# Patient Record
Sex: Female | Born: 1945
Health system: Southern US, Community
[De-identification: ages and names within clinical notes are randomized; demographics above are authoritative.]

## PROBLEM LIST (undated history)

## (undated) DIAGNOSIS — C50911 Malignant neoplasm of unspecified site of right female breast: Secondary | ICD-10-CM

## (undated) DIAGNOSIS — Q8500 Neurofibromatosis, unspecified: Secondary | ICD-10-CM

## (undated) DIAGNOSIS — J449 Chronic obstructive pulmonary disease, unspecified: Secondary | ICD-10-CM

## (undated) HISTORY — PX: ABDOMINAL HYSTERECTOMY: SHX81

## (undated) HISTORY — DX: Chronic obstructive pulmonary disease, unspecified: J44.9

## (undated) HISTORY — PX: CHOLECYSTECTOMY: SHX55

## (undated) HISTORY — PX: BREAST SURGERY: SHX581

## (undated) HISTORY — PX: MASTECTOMY: SHX3

## (undated) HISTORY — DX: Neurofibromatosis, unspecified: Q85.00

---

## 2001-01-16 DIAGNOSIS — C50911 Malignant neoplasm of unspecified site of right female breast: Secondary | ICD-10-CM

## 2001-01-16 HISTORY — DX: Malignant neoplasm of unspecified site of right female breast: C50.911

## 2006-10-30 ENCOUNTER — Ambulatory Visit: Payer: Self-pay | Admitting: Gastroenterology

## 2008-12-26 ENCOUNTER — Emergency Department: Payer: Self-pay | Admitting: Emergency Medicine
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
## 2011-06-21 ENCOUNTER — Emergency Department: Payer: Self-pay | Admitting: *Deleted

## 2011-06-21 LAB — URINALYSIS, COMPLETE
Bacteria: NONE SEEN
Bilirubin,UR: NEGATIVE
Glucose,UR: NEGATIVE mg/dL (ref 0–75)
Leukocyte Esterase: NEGATIVE
Nitrite: NEGATIVE
Ph: 5 (ref 4.5–8.0)
Protein: NEGATIVE
Specific Gravity: 1.016 (ref 1.003–1.030)
Squamous Epithelial: 4
WBC UR: 1 /HPF (ref 0–5)

## 2011-06-21 LAB — BASIC METABOLIC PANEL
Anion Gap: 8 (ref 7–16)
Calcium, Total: 8.4 mg/dL — ABNORMAL LOW (ref 8.5–10.1)
Co2: 26 mmol/L (ref 21–32)
Creatinine: 0.76 mg/dL (ref 0.60–1.30)
Potassium: 4.9 mmol/L (ref 3.5–5.1)

## 2011-06-21 LAB — CBC
HCT: 42.4 % (ref 35.0–47.0)
MCV: 91 fL (ref 80–100)
RBC: 4.63 10*6/uL (ref 3.80–5.20)
WBC: 7.7 10*3/uL (ref 3.6–11.0)

## 2014-09-10 ENCOUNTER — Encounter: Payer: Self-pay | Admitting: Family Medicine

## 2014-09-10 ENCOUNTER — Ambulatory Visit (INDEPENDENT_AMBULATORY_CARE_PROVIDER_SITE_OTHER): Payer: Commercial Managed Care - HMO | Admitting: Family Medicine

## 2014-09-10 VITALS — BP 114/69 | HR 64 | Temp 98.2°F | Ht 60.2 in | Wt 98.2 lb

## 2014-09-10 DIAGNOSIS — Z853 Personal history of malignant neoplasm of breast: Secondary | ICD-10-CM

## 2014-09-10 DIAGNOSIS — J449 Chronic obstructive pulmonary disease, unspecified: Secondary | ICD-10-CM

## 2014-09-10 DIAGNOSIS — Z23 Encounter for immunization: Secondary | ICD-10-CM | POA: Diagnosis not present

## 2014-09-10 DIAGNOSIS — E785 Hyperlipidemia, unspecified: Secondary | ICD-10-CM | POA: Diagnosis not present

## 2014-09-10 DIAGNOSIS — Z720 Tobacco use: Secondary | ICD-10-CM | POA: Insufficient documentation

## 2014-09-10 LAB — CBC WITH DIFFERENTIAL/PLATELET
HEMATOCRIT: 44.1 % (ref 34.0–46.6)
HEMOGLOBIN: 14.7 g/dL (ref 11.1–15.9)
LYMPHS: 18 %
Lymphocytes Absolute: 1.2 10*3/uL (ref 0.7–3.1)
MCH: 30.8 pg (ref 26.6–33.0)
MCHC: 33.3 g/dL (ref 31.5–35.7)
MCV: 93 fL (ref 79–97)
MID (Absolute): 0.7 10*3/uL (ref 0.1–1.6)
MID: 11 %
NEUTROS ABS: 4.6 10*3/uL (ref 1.4–7.0)
NEUTROS PCT: 71 %
Platelets: 246 10*3/uL (ref 150–379)
RBC: 4.77 x10E6/uL (ref 3.77–5.28)
RDW: 15 % (ref 12.3–15.4)
WBC: 6.5 10*3/uL (ref 3.4–10.8)

## 2014-09-10 LAB — LIPID PANEL PICCOLO, WAIVED
Chol/HDL Ratio Piccolo,Waive: 2.9 mg/dL
Cholesterol Piccolo, Waived: 216 mg/dL — ABNORMAL HIGH (ref <200)
HDL Chol Piccolo, Waived: 75 mg/dL (ref >59)
LDL Chol Calc Piccolo Waived: 122 mg/dL — ABNORMAL HIGH (ref <100)
Triglycerides Piccolo,Waived: 97 mg/dL (ref <150)
VLDL Chol Calc Piccolo,Waive: 19 mg/dL (ref <30)

## 2014-09-10 MED ORDER — TIOTROPIUM BROMIDE MONOHYDRATE 18 MCG IN CAPS: 18.0000 ug | ORAL_CAPSULE | Freq: Every day | RESPIRATORY_TRACT | Status: DC

## 2014-09-10 MED ORDER — ALBUTEROL SULFATE HFA 108 (90 BASE) MCG/ACT IN AERS: 2.0000 | INHALATION_SPRAY | Freq: Four times a day (QID) | RESPIRATORY_TRACT | Status: DC | PRN

## 2014-09-10 NOTE — Patient Instructions (Addendum)
Chronic Obstructive Pulmonary Disease Chronic obstructive pulmonary disease (COPD) is a common lung condition in which airflow from the lungs is limited. COPD is a general term that can be used to describe many different lung problems that limit airflow, including both chronic bronchitis and emphysema. If you have COPD, your lung function will probably never return to normal, but there are measures you can take to improve lung function and make yourself feel better.  CAUSES   Smoking (common).   Exposure to secondhand smoke.   Genetic problems.  Chronic inflammatory lung diseases or recurrent infections. SYMPTOMS   Shortness of breath, especially with physical activity.   Deep, persistent (chronic) cough with a large amount of thick mucus.   Wheezing.   Rapid breaths (tachypnea).   Gray or bluish discoloration (cyanosis) of the skin, especially in fingers, toes, or lips.   Fatigue.   Weight loss.   Frequent infections or episodes when breathing symptoms become much worse (exacerbations).   Chest tightness. DIAGNOSIS  Your health care provider will take a medical history and perform a physical examination to make the initial diagnosis. Additional tests for COPD may include:   Lung (pulmonary) function tests.  Chest X-ray.  CT scan.  Blood tests. TREATMENT  Treatment available to help you feel better when you have COPD includes:   Inhaler and nebulizer medicines. These help manage the symptoms of COPD and make your breathing more comfortable.  Supplemental oxygen. Supplemental oxygen is only helpful if you have a low oxygen level in your blood.   Exercise and physical activity. These are beneficial for nearly all people with COPD. Some people may also benefit from a pulmonary rehabilitation program. HOME CARE INSTRUCTIONS   Take all medicines (inhaled or pills) as directed by your health care provider.  Avoid over-the-counter medicines or cough syrups  that dry up your airway (such as antihistamines) and slow down the elimination of secretions unless instructed otherwise by your health care provider.   If you are a smoker, the most important thing that you can do is stop smoking. Continuing to smoke will cause further lung damage and breathing trouble. Ask your health care provider for help with quitting smoking. He or she can direct you to community resources or hospitals that provide support.  Avoid exposure to irritants such as smoke, chemicals, and fumes that aggravate your breathing.  Use oxygen therapy and pulmonary rehabilitation if directed by your health care provider. If you require home oxygen therapy, ask your health care provider whether you should purchase a pulse oximeter to measure your oxygen level at home.   Avoid contact with individuals who have a contagious illness.  Avoid extreme temperature and humidity changes.  Eat healthy foods. Eating smaller, more frequent meals and resting before meals may help you maintain your strength.  Stay active, but balance activity with periods of rest. Exercise and physical activity will help you maintain your ability to do things you want to do.  Preventing infection and hospitalization is very important when you have COPD. Make sure to receive all the vaccines your health care provider recommends, especially the pneumococcal and influenza vaccines. Ask your health care provider whether you need a pneumonia vaccine.  Learn and use relaxation techniques to manage stress.  Learn and use controlled breathing techniques as directed by your health care provider. Controlled breathing techniques include:   Pursed lip breathing. Start by breathing in (inhaling) through your nose for 1 second. Then, purse your lips as if you were   going to whistle and breathe out (exhale) through the pursed lips for 2 seconds.   Diaphragmatic breathing. Start by putting one hand on your abdomen just above  your waist. Inhale slowly through your nose. The hand on your abdomen should move out. Then purse your lips and exhale slowly. You should be able to feel the hand on your abdomen moving in as you exhale.   Learn and use controlled coughing to clear mucus from your lungs. Controlled coughing is a series of short, progressive coughs. The steps of controlled coughing are:  1. Lean your head slightly forward.  2. Breathe in deeply using diaphragmatic breathing.  3. Try to hold your breath for 3 seconds.  4. Keep your mouth slightly open while coughing twice.  5. Spit any mucus out into a tissue.  6. Rest and repeat the steps once or twice as needed. SEEK MEDICAL CARE IF:   You are coughing up more mucus than usual.   There is a change in the color or thickness of your mucus.   Your breathing is more labored than usual.   Your breathing is faster than usual.  SEEK IMMEDIATE MEDICAL CARE IF:   You have shortness of breath while you are resting.   You have shortness of breath that prevents you from:  Being able to talk.   Performing your usual physical activities.   You have chest pain lasting longer than 5 minutes.   Your skin color is more cyanotic than usual.  You measure low oxygen saturations for longer than 5 minutes with a pulse oximeter. MAKE SURE YOU:   Understand these instructions.  Will watch your condition.  Will get help right away if you are not doing well or get worse. Document Released: 10/12/2004 Document Revised: 05/19/2013 Document Reviewed: 08/29/2012 [facility] Patient Information 2015 [place], [place]. This information is not intended to replace advice given to you by your health care provider. Make sure you discuss any questions you have with your health care provider. Cholesterol Cholesterol is a fat. Your body needs a small amount of cholesterol. Cholesterol may build up in your blood vessels. This increases your chance of having a heart  attack or stroke. You cannot feel your cholesterol levels. The only way to know your cholesterol level is high is with a blood test. Keep your test results. Work with your doctor to keep your cholesterol at a good level. WHAT DO THE TEST RESULTS MEAN?  Total cholesterol is how much cholesterol is in your blood.  LDL is bad cholesterol. This is the type that can build up. You want LDL to be low.  HDL is good cholesterol. It cleans your blood vessels and carries LDL away. You want HDL to be high.  Triglycerides are fat that the body can burn for energy or store. WHAT ARE GOOD LEVELS OF CHOLESTEROL?  Total cholesterol below 200.  LDL below 100 for people at risk. Below 70 for those at very high risk.  HDL above 50 is good. Above 60 is best.  Triglycerides below 150. HOW CAN I LOWER MY CHOLESTEROL?  Diet. Follow your diet programs as told by your doctor.  Choose fish, white meat chicken, roasted [place], or baked [place]. Try not to eat red meat, fried foods, or processed meats such as sausage and lunch meats.  Eat lots of fresh fruits and vegetables.  Choose whole grains, beans, pasta, potatoes, and cereals.  Use only small amounts of olive, corn, or canola oils.  Try not  to eat butter, mayonnaise, shortening, or palm kernel oils.  Try not to eat foods with trans fats.  Drink skim or nonfat milk. Eat low-fat or nonfat yogurt and cheeses. Try not to drink whole milk or cream. Try not to eat ice cream, egg yolks, and full-fat cheeses.  Healthy desserts include angel food cake, ginger snaps, animal crackers, hard candy, popsicles, and low-fat or nonfat frozen yogurt. Try not to eat pastries, cakes, pies, and cookies.  Exercise. Follow your exercise programs as told by your doctor.  Be more active. You can try gardening, walking, or taking the stairs. Ask your doctor about how you can be more active.  Medicine. Take medicine as told by your doctor. Document Released: 03/31/2008  Document Revised: 05/19/2013 Document Reviewed: 10/16/2012 [facility] Patient Information 2015 [place], [place]. This information is not intended to replace advice given to you by your health care provider. Make sure you discuss any questions you have with your health care provider. You Can Quit Smoking If you are ready to quit smoking or are thinking about it, congratulations! You have chosen to help yourself be healthier and live longer! There are lots of different ways to quit smoking. Nicotine gum, nicotine patches, a nicotine inhaler, or nicotine nasal spray can help with physical craving. Hypnosis, support groups, and medicines help break the habit of smoking. TIPS TO GET OFF AND STAY OFF CIGARETTES  Learn to predict your moods. Do not let a bad situation be your excuse to have a cigarette. Some situations in your life might tempt you to have a cigarette.  Ask friends and co-workers not to smoke around you.  Make your home smoke-free.  Never have "just one" cigarette. It leads to wanting another and another. Remind yourself of your decision to quit.  On a card, make a list of your reasons for not smoking. Read it at least the same number of times a day as you have a cigarette. Tell yourself everyday, "I do not want to smoke. I choose not to smoke."  Ask someone at home or work to help you with your plan to quit smoking.  Have something planned after you eat or have a cup of coffee. Take a walk or get other exercise to perk you up. This will help to keep you from overeating.  Try a relaxation exercise to calm you down and decrease your stress. Remember, you may be tense and nervous the first two weeks after you quit. This will pass.  Find new activities to keep your hands busy. Play with a pen, coin, or rubber band. Doodle or draw things on paper.  Brush your teeth right after eating. This will help cut down the craving for the taste of tobacco after meals. You can try mouthwash too.  Try  gum, breath mints, or diet candy to keep something in your mouth. IF YOU SMOKE AND WANT TO QUIT:  Do not stock up on cigarettes. Never buy a carton. Wait until one pack is finished before you buy another.  Never carry cigarettes with you at work or at home.  Keep cigarettes as far away from you as possible. Leave them with someone else.  Never carry matches or a lighter with you.  Ask yourself, "Do I need this cigarette or is this just a reflex?"  Bet with someone that you can quit. Put cigarette money in a piggy bank every morning. If you smoke, you give up the money. If you do not smoke, by the end  of the week, you keep the money.  Keep trying. It takes 21 days to change a habit!  Talk to your doctor about using medicines to help you quit. These include nicotine replacement gum, lozenges, or skin patches. Document Released: 10/29/2008 Document Revised: 03/27/2011 Document Reviewed: 10/29/2008 [facility] Patient Information 2015 [place], [place]. This information is not intended to replace advice given to you by your health care provider. Make sure you discuss any questions you have with your health care provider.

## 2014-09-10 NOTE — Assessment & Plan Note (Signed)
Smoking about a cigarette a day. Encouraged patient to quit smoking. Education provided.

## 2014-09-10 NOTE — Assessment & Plan Note (Signed)
Will restart her spiriva and has ventolin as needed. Continue to monitor.

## 2014-09-10 NOTE — Assessment & Plan Note (Signed)
Borderline. Continue diet and exercise. Recheck in 6 months to a year.

## 2014-09-10 NOTE — Progress Notes (Signed)
BP 114/69 mmHg  Pulse 64  Temp(Src) 98.2 F (36.8 C)  Ht 5' 0.2" (1.529 m)  Wt 98 lb 3.2 oz (44.543 kg)  BMI 19.05 kg/m2  SpO2 95%   Subjective:    Patient ID: Brenda Rocha, female    DOB: [birth year], 69 y.o.   MRN: [id number]  HPI: Brenda Rocha is a 69 y.o. female  Chief Complaint  Patient presents with  . COPD  . Hyperlipidemia   COPD- has not been on her spiriva in quite a while COPD status: stable Satisfied with current treatment?: yes Oxygen use: no Dyspnea frequency: never Cough frequency: never Rescue inhaler frequency:  never Limitation of activity: no Productive cough: no Last Spirometry: Today Pneumovax: Up to Date  Prevnar: Given today Influenza: Up to Date  HYPERLIPIDEMIA Satisfied with current treatment?  yes Aspirin:  no Chest pain:  no Coronary artery disease:  no Family history CAD:  no Family history early CAD:  no  Relevant past medical, surgical, family and social history reviewed and updated as indicated. Interim medical history since our last visit reviewed. Allergies and medications reviewed and updated.  Review of Systems  Constitutional: Negative.   Respiratory: Negative.   Cardiovascular: Negative.   Gastrointestinal: Negative.   Psychiatric/Behavioral: Negative.    Per HPI unless specifically indicated above     Objective:    BP 114/69 mmHg  Pulse 64  Temp(Src) 98.2 F (36.8 C)  Ht 5' 0.2" (1.529 m)  Wt 98 lb 3.2 oz (44.543 kg)  BMI 19.05 kg/m2  SpO2 95%  Wt Readings from Last 3 Encounters:  09/10/14 98 lb 3.2 oz (44.543 kg)  11/28/12 93 lb (42.185 kg)    Physical Exam  Constitutional: She is oriented to person, place, and time. She appears well-developed and well-nourished. No distress.  HENT:  Head: Normocephalic and atraumatic.  Right Ear: Hearing normal.  Left Ear: Hearing normal.  Nose: Nose normal.  Eyes: Conjunctivae and lids are normal. Right eye exhibits no discharge. Left eye exhibits no discharge.  No scleral icterus.  Cardiovascular: Normal rate, regular rhythm, normal heart sounds and intact distal pulses.  Exam reveals no gallop and no friction rub.   No murmur heard. Pulmonary/Chest: Effort normal and breath sounds normal. No respiratory distress. She has no wheezes. She has no rales. She exhibits no tenderness.  Musculoskeletal: Normal range of motion.  Neurological: She is alert and oriented to person, place, and time.  Skin: Skin is warm, dry and intact. No rash noted. No erythema. No pallor.  Psychiatric: She has a normal mood and affect. Her speech is normal and behavior is normal. Judgment and thought content normal. Cognition and memory are normal.      Assessment & Plan:   Problem List Items Addressed This Visit      Respiratory   COLD (chronic obstructive lung disease) - Primary    Will restart her spiriva and has ventolin as needed. Continue to monitor.       Relevant Medications   tiotropium (SPIRIVA HANDIHALER) 18 MCG inhalation capsule   albuterol (PROVENTIL HFA;VENTOLIN HFA) 108 (90 BASE) MCG/ACT inhaler   Other Relevant Orders   Spirometry with Graph   CBC with Differential/Platelet   Spirometry with Graph (Completed)     Other   History of breast cancer    S/p double mastectomy. Cleared by surgery to not follow up.       Relevant Orders   Lipid Panel Piccolo, Waived   Comprehensive metabolic panel  CBC with Differential/Platelet   Hyperlipidemia    Borderline. Continue diet and exercise. Recheck in 6 months to a year.       Relevant Orders   Lipid Panel Piccolo, Waived   CBC with Differential/Platelet   Tobacco abuse    Smoking about a cigarette a day. Encouraged patient to quit smoking. Education provided.      Relevant Orders   Comprehensive metabolic panel   CBC with Differential/Platelet       Follow up plan: Return in about 6 months (around 03/13/2015) for medicare wellness.

## 2014-09-10 NOTE — Assessment & Plan Note (Signed)
S/p double mastectomy. Cleared by surgery to not follow up.

## 2014-09-11 ENCOUNTER — Encounter: Payer: Self-pay | Admitting: Family Medicine

## 2014-09-11 LAB — COMPREHENSIVE METABOLIC PANEL
A/G RATIO: 1.9 (ref 1.1–2.5)
ALBUMIN: 4 g/dL (ref 3.6–4.8)
ALT: 6 IU/L (ref 0–32)
AST: 12 IU/L (ref 0–40)
Alkaline Phosphatase: 69 IU/L (ref 39–117)
BILIRUBIN TOTAL: 0.6 mg/dL (ref 0.0–1.2)
BUN / CREAT RATIO: 13 (ref 11–26)
BUN: 11 mg/dL (ref 8–27)
CALCIUM: 9.2 mg/dL (ref 8.7–10.3)
CO2: 23 mmol/L (ref 18–29)
Chloride: 105 mmol/L (ref 97–108)
Creatinine, Ser: 0.84 mg/dL (ref 0.57–1.00)
GFR, EST AFRICAN AMERICAN: 82 mL/min/{1.73_m2} (ref >59)
GFR, EST NON AFRICAN AMERICAN: 71 mL/min/{1.73_m2} (ref >59)
GLOBULIN, TOTAL: 2.1 g/dL (ref 1.5–4.5)
Glucose: 89 mg/dL (ref 65–99)
POTASSIUM: 4.7 mmol/L (ref 3.5–5.2)
Sodium: 143 mmol/L (ref 134–144)
TOTAL PROTEIN: 6.1 g/dL (ref 6.0–8.5)

## 2014-09-14 ENCOUNTER — Telehealth: Payer: Self-pay | Admitting: Family Medicine

## 2014-09-14 NOTE — Telephone Encounter (Signed)
Called pharmacy. They have the prescriptions at the pharmacy in her chart. Let patient know this by voicemail.

## 2014-09-14 NOTE — Telephone Encounter (Signed)
Pt called stated pharmacy has not received RX for albuterol and Spiriva. Pharm is [facility] in [place]. Thanks.

## 2015-03-04 DIAGNOSIS — H5210 Myopia, unspecified eye: Secondary | ICD-10-CM | POA: Diagnosis not present

## 2015-03-04 DIAGNOSIS — H524 Presbyopia: Secondary | ICD-10-CM | POA: Diagnosis not present

## 2015-03-05 DIAGNOSIS — C50112 Malignant neoplasm of central portion of left female breast: Secondary | ICD-10-CM | POA: Diagnosis not present

## 2015-03-05 DIAGNOSIS — Z9013 Acquired absence of bilateral breasts and nipples: Secondary | ICD-10-CM | POA: Diagnosis not present

## 2015-03-05 DIAGNOSIS — C50111 Malignant neoplasm of central portion of right female breast: Secondary | ICD-10-CM | POA: Diagnosis not present

## 2015-03-10 DIAGNOSIS — Z0000 Encounter for general adult medical examination without abnormal findings: Secondary | ICD-10-CM | POA: Diagnosis not present

## 2015-03-10 DIAGNOSIS — F17200 Nicotine dependence, unspecified, uncomplicated: Secondary | ICD-10-CM | POA: Diagnosis not present

## 2015-03-10 DIAGNOSIS — Z853 Personal history of malignant neoplasm of breast: Secondary | ICD-10-CM | POA: Diagnosis not present

## 2015-03-10 DIAGNOSIS — E782 Mixed hyperlipidemia: Secondary | ICD-10-CM | POA: Diagnosis not present

## 2015-03-10 DIAGNOSIS — Z681 Body mass index (BMI) 19 or less, adult: Secondary | ICD-10-CM | POA: Diagnosis not present

## 2015-03-10 DIAGNOSIS — J449 Chronic obstructive pulmonary disease, unspecified: Secondary | ICD-10-CM | POA: Diagnosis not present

## 2015-03-11 ENCOUNTER — Encounter: Payer: Commercial Managed Care - HMO | Admitting: Family Medicine

## 2015-03-18 ENCOUNTER — Encounter: Payer: Self-pay | Admitting: Family Medicine

## 2015-03-18 ENCOUNTER — Ambulatory Visit (INDEPENDENT_AMBULATORY_CARE_PROVIDER_SITE_OTHER): Payer: Commercial Managed Care - HMO | Admitting: Family Medicine

## 2015-03-18 VITALS — BP 128/70 | HR 72 | Temp 97.8°F | Ht 60.2 in | Wt 95.0 lb

## 2015-03-18 DIAGNOSIS — Z0000 Encounter for general adult medical examination without abnormal findings: Secondary | ICD-10-CM

## 2015-03-18 DIAGNOSIS — R634 Abnormal weight loss: Secondary | ICD-10-CM

## 2015-03-18 DIAGNOSIS — E785 Hyperlipidemia, unspecified: Secondary | ICD-10-CM | POA: Diagnosis not present

## 2015-03-18 NOTE — Progress Notes (Signed)
BP 128/70 mmHg  Pulse 72  Temp(Src) 97.8 F (36.6 C)  Ht 5' 0.2" (1.529 m)  Wt 95 lb (43.092 kg)  BMI 18.43 kg/m2  SpO2 94%   Subjective:    Patient ID: Brenda Rocha, female    DOB: [birth year], 70 y.o.   MRN: [id number]  HPI: Brenda Rocha is a 70 y.o. female presenting on 03/18/2015 for comprehensive medical examination. Current medical complaints include: None  She currently lives with: her brother Menopausal Symptoms: no  Functional Ability / Safety Screening 1.  Was the timed Get Up and Go test longer than 30 seconds?  no 2.  Does the patient need help with the phone, transportation, shopping,      preparing meals, housework, laundry, medications, or managing money?  no 3.  Does the patient's home have:  loose throw rugs in the hallway?   no      Grab bars in the bathroom? no      Handrails on the stairs?   yes      Poor lighting?   no 4.  Has the patient noticed any hearing difficulties?   no  Advanced Directives Does patient have a HCPOA?    no Does patient have a living will or MOST form?  yes  Other providers: None  Depression Screen done today and results listed below:  Depression screen [facility] 2/9 03/18/2015  Decreased Interest 0  Down, Depressed, Hopeless 0  PHQ - 2 Score 0   The patient does not have a history of falls. I did not complete a risk assessment for falls. A plan of care for falls was not documented.  Past Medical History:  Past Medical History  Diagnosis Date  . Cancer ([place])     breast  . Neurofibromatosis ([place])   . COPD (chronic obstructive pulmonary disease) [facility])     Surgical History:  Past Surgical History  Procedure Laterality Date  . Abdominal hysterectomy    . Cholecystectomy    . Mastectomy Bilateral   . Breast surgery      mastectomy    Medications:  Current Outpatient Prescriptions on File Prior to Visit  Medication Sig  . albuterol (PROVENTIL HFA;VENTOLIN HFA) 108 (90 BASE) MCG/ACT inhaler Inhale 2 puffs into the lungs  every 6 (six) hours as needed for wheezing or shortness of breath.  . tiotropium (SPIRIVA HANDIHALER) 18 MCG inhalation capsule Place 1 capsule (18 mcg total) into inhaler and inhale daily.   No current facility-administered medications on file prior to visit.    Allergies:  No Known Allergies  Social History:  Social History   Social History  . Marital Status: Divorced    Spouse Name: N/A  . Number of Children: N/A  . Years of Education: N/A   Occupational History  . Not on file.   Social History Main Topics  . Smoking status: Current Every Day Smoker -- 0.25 packs/day    Types: Cigarettes  . Smokeless tobacco: Never Used  . Alcohol Use: 0.0 oz/week    0 Standard drinks or equivalent per week     Comment: on occasion  . Drug Use: No  . Sexual Activity: No   Other Topics Concern  . Not on file   Social History Narrative   History  Smoking status  . Current Every Day Smoker -- 0.25 packs/day  . Types: Cigarettes  Smokeless tobacco  . Never Used   History  Alcohol Use  . 0.0 oz/week  . 0 Standard drinks  or equivalent per week    Comment: on occasion    Family History:  Family History  Problem Relation Age of Onset  . Cancer Mother     breast  . Heart disease Mother   . Thyroid disease Mother   . Hypertension Mother   . Osteoporosis Mother   . Hypertension Sister   . Hypertension Brother   . Cancer Maternal Grandmother     breast , bone    Past medical history, surgical history, medications, allergies, family history and social history reviewed with patient today and changes made to appropriate areas of the chart.   Review of Systems  Constitutional: Positive for diaphoresis (occasionally at night with sleeping under a blanket). Negative for fever, chills, weight loss and malaise/fatigue.  HENT: Negative.   Eyes: Negative.   Respiratory: Negative.   Cardiovascular: Negative.   Gastrointestinal: Negative.   Genitourinary: Negative.    Musculoskeletal: Negative.   Skin: Negative.  Negative for itching and rash.  Neurological: Negative.  Negative for weakness.  Endo/Heme/Allergies: Positive for environmental allergies. Negative for polydipsia. Does not bruise/bleed easily.  Psychiatric/Behavioral: Negative.     All other ROS negative except what is listed above and in the HPI.      Objective:    BP 128/70 mmHg  Pulse 72  Temp(Src) 97.8 F (36.6 C)  Ht 5' 0.2" (1.529 m)  Wt 95 lb (43.092 kg)  BMI 18.43 kg/m2  SpO2 94%  Wt Readings from Last 3 Encounters:  03/18/15 95 lb (43.092 kg)  09/10/14 98 lb 3.2 oz (44.543 kg)  11/28/12 93 lb (42.185 kg)    Hearing Screening   125Hz  250Hz  500Hz  1000Hz  2000Hz  4000Hz  8000Hz   Right ear:   20 20 20 20    Left ear:   20 20 20 20      Visual Acuity Screening   Right eye Left eye Both eyes  Without correction:     With correction: 20/25 20/200 20/25   Cognitive Testing - 6-CIT  Correct? Score   What year is it? yes 0 Yes = 0    No = 4  What month is it? yes 0 Yes = 0    No = 3  Remember:     Brenda Rocha, [place], [place]     What time is it? yes 0 Yes = 0    No = 3  Count backwards from 20 to 1 yes 0 Correct = 0    1 error = 2   More than 1 error = 4  Say the months of the year in reverse. yes 2 Correct = 0    1 error = 2   More than 1 error = 4  What address did I ask you to remember? yes 0 Correct = 0  1 error = 2    2 error = 4    3 error = 6    4 error = 8    All wrong = 10       TOTAL SCORE  2/28   Interpretation:  Normal  Normal (0-7) Abnormal (8-28)    Physical Exam  Constitutional: She is oriented to person, place, and time. She appears well-developed and well-nourished. No distress.  HENT:  Head: Normocephalic and atraumatic.  Right Ear: Hearing, tympanic membrane, external ear and ear canal normal.  Left Ear: Hearing, tympanic membrane, external ear and ear canal normal.  Nose: Nose normal.  Mouth/Throat: Uvula is midline, oropharynx is clear  and  moist and mucous membranes are normal. No oropharyngeal exudate.  Eyes: Conjunctivae, EOM and lids are normal. Pupils are equal, round, and reactive to Rocha. Right eye exhibits no discharge. Left eye exhibits no discharge. No scleral icterus.  Neck: Normal range of motion. Neck supple. No JVD present. No tracheal deviation present. No thyromegaly present.  Cardiovascular: Normal rate, regular rhythm, normal heart sounds and intact distal pulses.  Exam reveals no gallop and no friction rub.   No murmur heard. Pulmonary/Chest: Effort normal and breath sounds normal. No stridor. No respiratory distress. She has no wheezes. She has no rales. She exhibits no tenderness.  Abdominal: Soft. Bowel sounds are normal. She exhibits no distension and no mass. There is no tenderness. There is no rebound and no guarding.  Genitourinary:  Deferred today at patient's request  Musculoskeletal: Normal range of motion. She exhibits no edema or tenderness.  Lymphadenopathy:    She has no cervical adenopathy.  Neurological: She is alert and oriented to person, place, and time. She has normal reflexes. She displays normal reflexes. No cranial nerve deficit. She exhibits normal muscle tone. Coordination normal.  Skin: Skin is warm, dry and intact. No rash noted. She is not diaphoretic. No erythema. No pallor.  Psychiatric: She has a normal mood and affect. Her speech is normal and behavior is normal. Judgment and thought content normal. Cognition and memory are normal.  Nursing note and vitals reviewed.   Results for orders placed or performed in visit on 09/10/14  Lipid Panel Piccolo, [employer]  Result Value Ref Range   Cholesterol Piccolo, Waived 216 (H) <200 mg/dL   HDL Chol Piccolo, Waived 75 >59 mg/dL   Triglycerides Piccolo,Waived 97 <150 mg/dL   Chol/HDL Ratio Piccolo,Waive 2.9 mg/dL   LDL Chol Calc Piccolo Waived 122 (H) <100 mg/dL   VLDL Chol Calc Piccolo,Waive 19 <30 mg/dL  Comprehensive metabolic  panel  Result Value Ref Range   Glucose 89 65 - 99 mg/dL   BUN 11 8 - 27 mg/dL   Creatinine, Ser 0.84 0.57 - 1.00 mg/dL   GFR calc non Af Amer 71 >59 mL/min/1.73   GFR calc Af Amer 82 >59 mL/min/1.73   BUN/Creatinine Ratio 13 11 - 26   Sodium 143 134 - 144 mmol/L   Potassium 4.7 3.5 - 5.2 mmol/L   Chloride 105 97 - 108 mmol/L   CO2 23 18 - 29 mmol/L   Calcium 9.2 8.7 - 10.3 mg/dL   Total Protein 6.1 6.0 - 8.5 g/dL   Albumin 4.0 3.6 - 4.8 g/dL   Globulin, Total 2.1 1.5 - 4.5 g/dL   Albumin/Globulin Ratio 1.9 1.1 - 2.5   Bilirubin Total 0.6 0.0 - 1.2 mg/dL   Alkaline Phosphatase 69 39 - 117 IU/L   AST 12 0 - 40 IU/L   ALT 6 0 - 32 IU/L  CBC With Differential/Platelet  Result Value Ref Range   WBC 6.5 3.4 - 10.8 x10E3/uL   RBC 4.77 3.77 - 5.28 x10E6/uL   Hemoglobin 14.7 11.1 - 15.9 g/dL   Hematocrit 44.1 34.0 - 46.6 %   MCV 93 79 - 97 fL   MCH 30.8 26.6 - 33.0 pg   MCHC 33.3 31.5 - 35.7 g/dL   RDW 15.0 12.3 - 15.4 %   Platelets 246 150 - 379 x10E3/uL   Neutrophils 71 %   Lymphs 18 %   MID 11 %   Neutrophils Absolute 4.6 1.4 - 7.0 x10E3/uL   Lymphocytes Absolute 1.2 0.7 -  3.1 x10E3/uL   MID (Absolute) 0.7 0.1 - 1.6 X10E3/uL      Assessment & Plan:   Problem List Items Addressed This Visit      Other   Hyperlipidemia   Relevant Orders   Comprehensive metabolic panel   Lipid Panel w/o Chol/HDL Ratio    Other Visit Diagnoses    Medicare annual wellness visit, subsequent    -  Primary    Discussed prevention as detailed. Checking labs. Continue to monitor.    Relevant Orders    CBC with Differential/Platelet    Comprehensive metabolic panel    Lipid Panel w/o Chol/HDL Ratio    TSH    Weight loss        Will check TSH and CBC. W    Relevant Orders    CBC with Differential/Platelet    Comprehensive metabolic panel    TSH        Follow up plan: Return in about 6 months (around 09/18/2015) for follow up weight and HLD.  Preventative Services:  Health Risk  Assessment and Personalized Prevention Plan: Bone Mass Measurements: up to date Breast Cancer Screening: n/A CVD Screening: done today Cervical Cancer Screening: N/A Colon Cancer Screening: Up to date Depression Screening: done today Diabetes Screening: done toray Glaucoma Screening: see eye doctoe Hepatitis B vaccine: n/a Hepatitis C screening: refused HIV Screening: refused Flu Vaccine: up to date Lung cancer Screening:  Obesity Screening: done today Pneumonia Vaccines (2): up to date STI Screening: refused PSA screening: n/a   LABORATORY TESTING:  - Pap smear: not applicable  IMMUNIZATIONS:   - Tdap: Tetanus vaccination status reviewed: last tetanus booster within 10 years. - Influenza: Up to date - Pneumovax: Up to date - Prevnar: Up to date  SCREENING: -Mammogram: Not applicable  - Colonoscopy: Up to date  - Bone Density: Up to date  -Hearing Test: Up to date   PATIENT COUNSELING:   Advised to take 1 mg of folate supplement per day if capable of pregnancy.   Sexuality: Discussed sexually transmitted diseases, partner selection, use of condoms, avoidance of unintended pregnancy  and contraceptive alternatives.   Advised to avoid cigarette smoking.  I discussed with the patient that most people either abstain from alcohol or drink within safe limits (<=14/week and <=4 drinks/occasion for males, <=7/weeks and <= 3 drinks/occasion for females) and that the risk for alcohol disorders and other health effects rises proportionally with the number of drinks per week and how often a drinker exceeds daily limits.  Discussed cessation/primary prevention of drug use and availability of treatment for abuse.   Diet: Encouraged to adjust caloric intake to maintain  or achieve ideal body weight, to reduce intake of dietary saturated fat and total fat, to limit sodium intake by avoiding high sodium foods and not adding table salt, and to maintain adequate dietary potassium and  calcium preferably from fresh fruits, vegetables, and low-fat dairy products.    stressed the importance of regular exercise  Injury prevention: Discussed safety belts, safety helmets, smoke detector, smoking near bedding or upholstery.   Dental health: Discussed importance of regular tooth brushing, flossing, and dental visits.    NEXT PREVENTATIVE PHYSICAL DUE IN 1 YEAR. Return in about 6 months (around 09/18/2015) for follow up weight and HLD.

## 2015-03-18 NOTE — Patient Instructions (Addendum)
Preventative Services:  Health Risk Assessment and Personalized Prevention Plan: Bone Mass Measurements: up to date Breast Cancer Screening: n/A CVD Screening: done today Cervical Cancer Screening: N/A Colon Cancer Screening: Up to date Depression Screening: done today Diabetes Screening: done toray Glaucoma Screening: see eye doctoe Hepatitis B vaccine: n/a Hepatitis C screening: refused HIV Screening: refused Flu Vaccine: up to date Lung cancer Screening:  Obesity Screening: done today Pneumonia Vaccines (2): up to date STI Screening: refused PSA screening: n/a  Menopause is a normal process in which your reproductive ability comes to an end. This process happens gradually over a span of months to years, usually between the ages of 19 and 80. Menopause is complete when you have missed 12 consecutive menstrual periods. It is important to talk with your health care provider about some of the most common conditions that affect postmenopausal women, such as heart disease, cancer, and bone loss (osteoporosis). Adopting a healthy lifestyle and getting preventive care can help to promote your health and wellness. Those actions can also lower your chances of developing some of these common conditions. WHAT SHOULD I KNOW ABOUT MENOPAUSE? During menopause, you may experience a number of symptoms, such as:  Moderate-to-severe hot flashes.  Night sweats.  Decrease in sex drive.  Mood swings.  Headaches.  Tiredness.  Irritability.  Memory problems.  Insomnia. Choosing to treat or not to treat menopausal changes is an individual decision that you make with your health care provider. WHAT SHOULD I KNOW ABOUT HORMONE REPLACEMENT THERAPY AND SUPPLEMENTS? Hormone therapy products are effective for treating symptoms that are associated with menopause, such as hot flashes and night sweats. Hormone replacement carries certain risks, especially as you become older. If you are thinking about  using estrogen or estrogen with progestin treatments, discuss the benefits and risks with your health care provider. WHAT SHOULD I KNOW ABOUT HEART DISEASE AND STROKE? Heart disease, heart attack, and stroke become more likely as you age. This may be due, in part, to the hormonal changes that your body experiences during menopause. These can affect how your body processes dietary fats, triglycerides, and cholesterol. Heart attack and stroke are both medical emergencies. There are many things that you can do to help prevent heart disease and stroke:  Have your blood pressure checked at least every 1-2 years. High blood pressure causes heart disease and increases the risk of stroke.  If you are 63-56 years old, ask your health care provider if you should take aspirin to prevent a heart attack or a stroke.  Do not use any tobacco products, including cigarettes, chewing tobacco, or electronic cigarettes. If you need help quitting, ask your health care provider.  It is important to eat a healthy diet and maintain a healthy weight.  Be sure to include plenty of vegetables, fruits, low-fat dairy products, and lean protein.  Avoid eating foods that are high in solid fats, added sugars, or salt (sodium).  Get regular exercise. This is one of the most important things that you can do for your health.  Try to exercise for at least 150 minutes each week. The type of exercise that you do should increase your heart rate and make you sweat. This is known as moderate-intensity exercise.  Try to do strengthening exercises at least twice each week. Do these in addition to the moderate-intensity exercise.  Know your numbers.Ask your health care provider to check your cholesterol and your blood glucose. Continue to have your blood tested as directed by your  health care provider. WHAT SHOULD I KNOW ABOUT CANCER SCREENING? There are several types of cancer. Take the following steps to reduce your risk and to  catch any cancer development as early as possible. Breast Cancer  Practice breast self-awareness.  This means understanding how your breasts normally appear and feel.  It also means doing regular breast self-exams. Let your health care provider know about any changes, no matter how small.  If you are 45 or older, have a clinician do a breast exam (clinical breast exam or CBE) every year. Depending on your age, family history, and medical history, it may be recommended that you also have a yearly breast X-ray (mammogram).  If you have a family history of breast cancer, talk with your health care provider about genetic screening.  If you are at high risk for breast cancer, talk with your health care provider about having an MRI and a mammogram every year.  Breast cancer (BRCA) gene test is recommended for women who have family members with BRCA-related cancers. Results of the assessment will determine the need for genetic counseling and BRCA1 and for BRCA2 testing. BRCA-related cancers include these types:  Breast. This occurs in males or females.  Ovarian.  Tubal. This may also be called fallopian tube cancer.  Cancer of the abdominal or pelvic lining (peritoneal cancer).  Prostate.  Pancreatic. Cervical, Uterine, and Ovarian Cancer Your health care provider may recommend that you be screened regularly for cancer of the pelvic organs. These include your ovaries, uterus, and vagina. This screening involves a pelvic exam, which includes checking for microscopic changes to the surface of your cervix (Pap test).  For women ages 21-65, health care providers may recommend a pelvic exam and a Pap test every three years. For women ages 28-65, they may recommend the Pap test and pelvic exam, combined with testing for human papilloma virus (HPV), every five years. Some types of HPV increase your risk of cervical cancer. Testing for HPV may also be done on women of any age who have unclear Pap  test results.  Other health care providers may not recommend any screening for nonpregnant women who are considered low risk for pelvic cancer and have no symptoms. Ask your health care provider if a screening pelvic exam is right for you.  If you have had past treatment for cervical cancer or a condition that could lead to cancer, you need Pap tests and screening for cancer for at least 20 years after your treatment. If Pap tests have been discontinued for you, your risk factors (such as having a new sexual partner) need to be reassessed to determine if you should start having screenings again. Some women have medical problems that increase the chance of getting cervical cancer. In these cases, your health care provider may recommend that you have screening and Pap tests more often.  If you have a family history of uterine cancer or ovarian cancer, talk with your health care provider about genetic screening.  If you have vaginal bleeding after reaching menopause, tell your health care provider.  There are currently no reliable tests available to screen for ovarian cancer. Lung Cancer Lung cancer screening is recommended for adults 52-75 years old who are at high risk for lung cancer because of a history of smoking. A yearly low-dose CT scan of the lungs is recommended if you:  Currently smoke.  Have a history of at least 30 pack-years of smoking and you currently smoke or have quit within the  past 15 years. A pack-year is smoking an average of one pack of cigarettes per day for one year. Yearly screening should:  Continue until it has been 15 years since you quit.  Stop if you develop a health problem that would prevent you from having lung cancer treatment. Colorectal Cancer  This type of cancer can be detected and can often be prevented.  Routine colorectal cancer screening usually begins at age 34 and continues through age 68.  If you have risk factors for colon cancer, your health  care provider may recommend that you be screened at an earlier age.  If you have a family history of colorectal cancer, talk with your health care provider about genetic screening.  Your health care provider may also recommend using home test kits to check for hidden blood in your stool.  A small camera at the end of a tube can be used to examine your colon directly (sigmoidoscopy or colonoscopy). This is done to check for the earliest forms of colorectal cancer.  Direct examination of the colon should be repeated every 5-10 years until age 18. However, if early forms of precancerous polyps or small growths are found or if you have a family history or genetic risk for colorectal cancer, you may need to be screened more often. Skin Cancer  Check your skin from head to toe regularly.  Monitor any moles. Be sure to tell your health care provider:  About any new moles or changes in moles, especially if there is a change in a mole's shape or color.  If you have a mole that is larger than the size of a pencil eraser.  If any of your family members has a history of skin cancer, especially at a young age, talk with your health care provider about genetic screening.  Always use sunscreen. Apply sunscreen liberally and repeatedly throughout the day.  Whenever you are outside, protect yourself by wearing long sleeves, pants, a wide-brimmed hat, and sunglasses. WHAT SHOULD I KNOW ABOUT OSTEOPOROSIS? Osteoporosis is a condition in which bone destruction happens more quickly than new bone creation. After menopause, you may be at an increased risk for osteoporosis. To help prevent osteoporosis or the bone fractures that can happen because of osteoporosis, the following is recommended:  If you are 75-38 years old, get at least 1,000 mg of calcium and at least 600 mg of vitamin D per day.  If you are older than age 33 but younger than age 42, get at least 1,200 mg of calcium and at least 600 mg of  vitamin D per day.  If you are older than age 28, get at least 1,200 mg of calcium and at least 800 mg of vitamin D per day. Smoking and excessive alcohol intake increase the risk of osteoporosis. Eat foods that are rich in calcium and vitamin D, and do weight-bearing exercises several times each week as directed by your health care provider. WHAT SHOULD I KNOW ABOUT HOW MENOPAUSE AFFECTS [place]? Depression may occur at any age, but it is more common as you become older. Common symptoms of depression include:  Low or sad mood.  Changes in sleep patterns.  Changes in appetite or eating patterns.  Feeling an overall lack of motivation or enjoyment of activities that you previously enjoyed.  Frequent crying spells. Talk with your health care provider if you think that you are experiencing depression. WHAT SHOULD I KNOW ABOUT IMMUNIZATIONS? It is important that you get and maintain  your immunizations. These include:  Tetanus, diphtheria, and pertussis (Tdap) booster vaccine.  Influenza every year before the flu season begins.  Pneumonia vaccine.  Shingles vaccine. Your health care provider may also recommend other immunizations.   This information is not intended to replace advice given to you by your health care provider. Make sure you discuss any questions you have with your health care provider.   Document Released: 02/24/2005 Document Revised: 01/23/2014 Document Reviewed: 09/04/2013 Elsevier Interactive Patient Education [employer].

## 2015-03-19 ENCOUNTER — Encounter: Payer: Self-pay | Admitting: Family Medicine

## 2015-03-19 LAB — COMPREHENSIVE METABOLIC PANEL
ALBUMIN: 4.3 g/dL (ref 3.5–4.8)
ALK PHOS: 77 IU/L (ref 39–117)
ALT: 8 IU/L (ref 0–32)
AST: 17 IU/L (ref 0–40)
Albumin/Globulin Ratio: 2 (ref 1.1–2.5)
BUN/Creatinine Ratio: 13 (ref 11–26)
BUN: 9 mg/dL (ref 8–27)
Bilirubin Total: 0.6 mg/dL (ref 0.0–1.2)
CO2: 21 mmol/L (ref 18–29)
CREATININE: 0.69 mg/dL (ref 0.57–1.00)
Calcium: 9 mg/dL (ref 8.7–10.3)
Chloride: 103 mmol/L (ref 96–106)
GFR calc Af Amer: 102 mL/min/{1.73_m2} (ref >59)
GFR calc non Af Amer: 88 mL/min/{1.73_m2} (ref >59)
GLUCOSE: 84 mg/dL (ref 65–99)
Globulin, Total: 2.2 g/dL (ref 1.5–4.5)
Potassium: 4.4 mmol/L (ref 3.5–5.2)
Sodium: 140 mmol/L (ref 134–144)
Total Protein: 6.5 g/dL (ref 6.0–8.5)

## 2015-03-19 LAB — LIPID PANEL W/O CHOL/HDL RATIO
CHOLESTEROL TOTAL: 227 mg/dL — AB (ref 100–199)
HDL: 71 mg/dL (ref >39)
LDL CALC: 131 mg/dL — AB (ref 0–99)
TRIGLYCERIDES: 125 mg/dL (ref 0–149)
VLDL CHOLESTEROL CAL: 25 mg/dL (ref 5–40)

## 2015-03-19 LAB — CBC WITH DIFFERENTIAL/PLATELET
BASOS ABS: 0 10*3/uL (ref 0.0–0.2)
Basos: 1 %
EOS (ABSOLUTE): 0.1 10*3/uL (ref 0.0–0.4)
Eos: 1 %
HEMOGLOBIN: 15 g/dL (ref 11.1–15.9)
Hematocrit: 43.5 % (ref 34.0–46.6)
Immature Grans (Abs): 0 10*3/uL (ref 0.0–0.1)
Immature Granulocytes: 0 %
LYMPHS ABS: 1.4 10*3/uL (ref 0.7–3.1)
LYMPHS: 22 %
MCH: 30 pg (ref 26.6–33.0)
MCHC: 34.5 g/dL (ref 31.5–35.7)
MCV: 87 fL (ref 79–97)
MONOCYTES: 8 %
Monocytes Absolute: 0.5 10*3/uL (ref 0.1–0.9)
Neutrophils Absolute: 4.3 10*3/uL (ref 1.4–7.0)
Neutrophils: 68 %
PLATELETS: 242 10*3/uL (ref 150–379)
RBC: 5 x10E6/uL (ref 3.77–5.28)
RDW: 14.3 % (ref 12.3–15.4)
WBC: 6.3 10*3/uL (ref 3.4–10.8)

## 2015-03-19 LAB — TSH: TSH: 0.824 u[IU]/mL (ref 0.450–4.500)

## 2015-09-23 ENCOUNTER — Ambulatory Visit: Payer: Commercial Managed Care - HMO | Admitting: Family Medicine

## 2015-10-06 DIAGNOSIS — Z9013 Acquired absence of bilateral breasts and nipples: Secondary | ICD-10-CM | POA: Diagnosis not present

## 2015-10-06 DIAGNOSIS — C50111 Malignant neoplasm of central portion of right female breast: Secondary | ICD-10-CM | POA: Diagnosis not present

## 2015-10-06 DIAGNOSIS — C50112 Malignant neoplasm of central portion of left female breast: Secondary | ICD-10-CM | POA: Diagnosis not present

## 2015-10-07 ENCOUNTER — Encounter: Payer: Self-pay | Admitting: Family Medicine

## 2015-10-07 ENCOUNTER — Ambulatory Visit (INDEPENDENT_AMBULATORY_CARE_PROVIDER_SITE_OTHER): Payer: Commercial Managed Care - HMO | Admitting: Family Medicine

## 2015-10-07 VITALS — BP 114/68 | HR 65 | Temp 98.6°F | Wt 96.0 lb

## 2015-10-07 DIAGNOSIS — R634 Abnormal weight loss: Secondary | ICD-10-CM | POA: Diagnosis not present

## 2015-10-07 DIAGNOSIS — J449 Chronic obstructive pulmonary disease, unspecified: Secondary | ICD-10-CM

## 2015-10-07 DIAGNOSIS — Z13820 Encounter for screening for osteoporosis: Secondary | ICD-10-CM | POA: Diagnosis not present

## 2015-10-07 DIAGNOSIS — E785 Hyperlipidemia, unspecified: Secondary | ICD-10-CM

## 2015-10-07 DIAGNOSIS — Z23 Encounter for immunization: Secondary | ICD-10-CM | POA: Diagnosis not present

## 2015-10-07 LAB — LIPID PANEL PICCOLO, WAIVED
CHOLESTEROL PICCOLO, WAIVED: 191 mg/dL (ref <200)
Chol/HDL Ratio Piccolo,Waive: 2.8 mg/dL
HDL CHOL PICCOLO, WAIVED: 69 mg/dL (ref >59)
LDL CHOL CALC PICCOLO WAIVED: 106 mg/dL — AB (ref <100)
TRIGLYCERIDES PICCOLO,WAIVED: 81 mg/dL (ref <150)
VLDL Chol Calc Piccolo,Waive: 16 mg/dL (ref <30)

## 2015-10-07 MED ORDER — ALBUTEROL SULFATE HFA 108 (90 BASE) MCG/ACT IN AERS: 2.0000 | INHALATION_SPRAY | Freq: Four times a day (QID) | RESPIRATORY_TRACT | 6 refills | Status: DC | PRN

## 2015-10-07 MED ORDER — TIOTROPIUM BROMIDE MONOHYDRATE 18 MCG IN CAPS: 18.0000 ug | ORAL_CAPSULE | Freq: Every day | RESPIRATORY_TRACT | 12 refills | Status: DC

## 2015-10-07 NOTE — Assessment & Plan Note (Signed)
Stable. Continue to monitor. Recheck 6 months to a year. Continue diet and exercise.

## 2015-10-07 NOTE — Assessment & Plan Note (Signed)
Stable. Refills given today. Call with any concerns.  

## 2015-10-07 NOTE — Progress Notes (Signed)
BP 114/68 (BP Location: Right Leg, Patient Position: Sitting, Cuff Size: Small)   Pulse 65   Temp 98.6 F (37 C)   Wt 96 lb (43.5 kg)   SpO2 97%   BMI 18.62 kg/m    Subjective:    Patient ID: Brenda Rocha, female    DOB: [birth year], 70 y.o.   MRN: [id number]  HPI: Brenda Rocha is a 70 y.o. female  Chief Complaint  Patient presents with  . Hyperlipidemia  . Weight Check  . COPD    Patient needs refills   HYPERLIPIDEMIA Hyperlipidemia status: stable Past cholesterol meds: none Supplements: none Aspirin:  no Chest pain:  no Coronary artery disease:  no Family history CAD:  yes  COPD COPD status: stable Satisfied with current treatment?: yes Oxygen use: no Dyspnea frequency: occasional Cough frequency: occasional Rescue inhaler frequency:  rarely Limitation of activity: no Productive cough: no Pneumovax: Up to Date Influenza: Up to Date  Weight has stabilized. No more weight loss. Has gained 1 lb, had gained more, but has had a bunch of losses recently  Relevant past medical, surgical, family and social history reviewed and updated as indicated. Interim medical history since our last visit reviewed. Allergies and medications reviewed and updated.  Review of Systems  Constitutional: Negative.   HENT: Negative.   Respiratory: Negative.   Cardiovascular: Negative.   Psychiatric/Behavioral: Negative.     Per HPI unless specifically indicated above     Objective:    BP 114/68 (BP Location: Right Leg, Patient Position: Sitting, Cuff Size: Small)   Pulse 65   Temp 98.6 F (37 C)   Wt 96 lb (43.5 kg)   SpO2 97%   BMI 18.62 kg/m   Wt Readings from Last 3 Encounters:  10/07/15 96 lb (43.5 kg)  03/18/15 95 lb (43.1 kg)  09/10/14 98 lb 3.2 oz (44.5 kg)    Physical Exam  Constitutional: She is oriented to person, place, and time. She appears well-developed and well-nourished. No distress.  HENT:  Head: Normocephalic and atraumatic.  Right Ear:  Hearing normal.  Left Ear: Hearing normal.  Nose: Nose normal.  Eyes: Conjunctivae and lids are normal. Right eye exhibits no discharge. Left eye exhibits no discharge. No scleral icterus.  Cardiovascular: Normal rate, regular rhythm, normal heart sounds and intact distal pulses.  Exam reveals no gallop and no friction rub.   No murmur heard. Pulmonary/Chest: Effort normal. No respiratory distress. She has decreased breath sounds in the right upper field, the right middle field, the right lower field, the left upper field, the left middle field and the left lower field. She has no wheezes. She has no rales. She exhibits no tenderness.  Musculoskeletal: Normal range of motion.  Neurological: She is alert and oriented to person, place, and time.  Skin: Skin is warm, dry and intact. No rash noted. No erythema. No pallor.  Psychiatric: She has a normal mood and affect. Her speech is normal and behavior is normal. Judgment and thought content normal. Cognition and memory are normal.  Nursing note and vitals reviewed.   Results for orders placed or performed in visit on 03/18/15  CBC with Differential/Platelet  Result Value Ref Range   WBC 6.3 3.4 - 10.8 x10E3/uL   RBC 5.00 3.77 - 5.28 x10E6/uL   Hemoglobin 15.0 11.1 - 15.9 g/dL   Hematocrit 43.5 34.0 - 46.6 %   MCV 87 79 - 97 fL   MCH 30.0 26.6 - 33.0 pg  MCHC 34.5 31.5 - 35.7 g/dL   RDW 14.3 12.3 - 15.4 %   Platelets 242 150 - 379 x10E3/uL   Neutrophils 68 %   Lymphs 22 %   Monocytes 8 %   Eos 1 %   Basos 1 %   Neutrophils Absolute 4.3 1.4 - 7.0 x10E3/uL   Lymphocytes Absolute 1.4 0.7 - 3.1 x10E3/uL   Monocytes Absolute 0.5 0.1 - 0.9 x10E3/uL   EOS (ABSOLUTE) 0.1 0.0 - 0.4 x10E3/uL   Basophils Absolute 0.0 0.0 - 0.2 x10E3/uL   Immature Granulocytes 0 %   Immature Grans (Abs) 0.0 0.0 - 0.1 x10E3/uL  Comprehensive metabolic panel  Result Value Ref Range   Glucose 84 65 - 99 mg/dL   BUN 9 8 - 27 mg/dL   Creatinine, Ser 0.69 0.57 -  1.00 mg/dL   GFR calc non Af Amer 88 >59 mL/min/1.73   GFR calc Af Amer 102 >59 mL/min/1.73   BUN/Creatinine Ratio 13 11 - 26   Sodium 140 134 - 144 mmol/L   Potassium 4.4 3.5 - 5.2 mmol/L   Chloride 103 96 - 106 mmol/L   CO2 21 18 - 29 mmol/L   Calcium 9.0 8.7 - 10.3 mg/dL   Total Protein 6.5 6.0 - 8.5 g/dL   Albumin 4.3 3.5 - 4.8 g/dL   Globulin, Total 2.2 1.5 - 4.5 g/dL   Albumin/Globulin Ratio 2.0 1.1 - 2.5   Bilirubin Total 0.6 0.0 - 1.2 mg/dL   Alkaline Phosphatase 77 39 - 117 IU/L   AST 17 0 - 40 IU/L   ALT 8 0 - 32 IU/L  Lipid Panel w/o Chol/HDL Ratio  Result Value Ref Range   Cholesterol, Total 227 (H) 100 - 199 mg/dL   Triglycerides 125 0 - 149 mg/dL   HDL 71 >39 mg/dL   VLDL Cholesterol Cal 25 5 - 40 mg/dL   LDL Calculated 131 (H) 0 - 99 mg/dL  TSH  Result Value Ref Range   TSH 0.824 0.450 - 4.500 uIU/mL      Assessment & Plan:   Problem List Items Addressed This Visit      Respiratory   COLD (chronic obstructive lung disease) (HCC)    Stable. Refills given today. Call with any concerns.       Relevant Medications   tiotropium (SPIRIVA HANDIHALER) 18 MCG inhalation capsule   albuterol (PROVENTIL HFA;VENTOLIN HFA) 108 (90 Base) MCG/ACT inhaler     Other   Hyperlipidemia - Primary    Stable. Continue to monitor. Recheck 6 months to a year. Continue diet and exercise.       Relevant Orders   Lipid Panel Piccolo, Waived    Other Visit Diagnoses    Screening for osteoporosis       DEXA ordered today.   Relevant Orders   DG Bone Density   Immunization due       Flu shot given today.   Relevant Orders   Flu vaccine HIGH DOSE PF (Fluzone High dose) (Completed)   Weight loss       Has stablilized. No more weight loss. Continue to monitor.        Follow up plan: Return in about 6 months (around 04/05/2016) for Wellness.

## 2015-10-07 NOTE — Patient Instructions (Addendum)
Influenza (Flu) Vaccine (Inactivated or Recombinant):  1. Why get vaccinated? Influenza ("flu") is a contagious disease that spreads around the [place] every year, usually between October and May. Flu is caused by influenza viruses, and is spread mainly by coughing, sneezing, and close contact. Anyone can get flu. Flu strikes suddenly and can last several days. Symptoms vary by age, but can include:  fever/chills  sore throat  muscle aches  fatigue  cough  headache  runny or stuffy nose Flu can also lead to pneumonia and blood infections, and cause diarrhea and seizures in children. If you have a medical condition, such as heart or lung disease, flu can make it worse. Flu is more dangerous for some people. Infants and young children, people 65 years of age and older, pregnant women, and people with certain health conditions or a weakened immune system are at greatest risk. Each year thousands of people in the [place] die from flu, and many more are hospitalized. Flu vaccine can:  keep you from getting flu,  make flu less severe if you do get it, and  keep you from spreading flu to your family and other people. 2. Inactivated and recombinant flu vaccines A dose of flu vaccine is recommended every flu season. Children 6 months through 8 years of age may need two doses during the same flu season. Everyone else needs only one dose each flu season. Some inactivated flu vaccines contain a very small amount of a mercury-based preservative called thimerosal. Studies have not shown thimerosal in vaccines to be harmful, but flu vaccines that do not contain thimerosal are available. There is no live flu virus in flu shots. They cannot cause the flu. There are many flu viruses, and they are always changing. Each year a new flu vaccine is made to protect against three or four viruses that are likely to cause disease in the upcoming flu season. But even when the vaccine doesn't exactly  match these viruses, it may still provide some protection. Flu vaccine cannot prevent:  flu that is caused by a virus not covered by the vaccine, or  illnesses that look like flu but are not. It takes about 2 weeks for protection to develop after vaccination, and protection lasts through the flu season. 3. Some people should not get this vaccine Tell the person who is giving you the vaccine:  If you have any severe, life-threatening allergies. If you ever had a life-threatening allergic reaction after a dose of flu vaccine, or have a severe allergy to any part of this vaccine, you may be advised not to get vaccinated. Most, but not all, types of flu vaccine contain a small amount of egg protein.  If you ever had Guillain-Barre Syndrome (also called GBS). Some people with a history of GBS should not get this vaccine. This should be discussed with your doctor.  If you are not feeling well. It is usually okay to get flu vaccine when you have a mild illness, but you might be asked to come back when you feel better. 4. Risks of a vaccine reaction With any medicine, including vaccines, there is a chance of reactions. These are usually mild and go away on their own, but serious reactions are also possible. Most people who get a flu shot do not have any problems with it. Minor problems following a flu shot include:  soreness, redness, or swelling where the shot was given  hoarseness  sore, red or itchy eyes  cough    fever  aches  headache  itching  fatigue If these problems occur, they usually begin soon after the shot and last 1 or 2 days. More serious problems following a flu shot can include the following:  There may be a small increased risk of Guillain-Barre Syndrome (GBS) after inactivated flu vaccine. This risk has been estimated at 1 or 2 additional cases per million people vaccinated. This is much lower than the risk of severe complications from flu, which can be prevented by  flu vaccine.  Young children who get the flu shot along with pneumococcal vaccine (PCV13) and/or DTaP vaccine at the same time might be slightly more likely to have a seizure caused by fever. Ask your doctor for more information. Tell your doctor if a child who is getting flu vaccine has ever had a seizure. Problems that could happen after any injected vaccine:  People sometimes faint after a medical procedure, including vaccination. Sitting or lying down for about 15 minutes can help prevent fainting, and injuries caused by a fall. Tell your doctor if you feel dizzy, or have vision changes or ringing in the ears.  Some people get severe pain in the shoulder and have difficulty moving the arm where a shot was given. This happens very rarely.  Any medication can cause a severe allergic reaction. Such reactions from a vaccine are very rare, estimated at about 1 in a million doses, and would happen within a few minutes to a few hours after the vaccination. As with any medicine, there is a very remote chance of a vaccine causing a serious injury or death. The safety of vaccines is always being monitored. For more information, visit: www.cdc.gov/vaccinesafety/ 5. What if there is a serious reaction? What should I look for?  Look for anything that concerns you, such as signs of a severe allergic reaction, very high fever, or unusual behavior. Signs of a severe allergic reaction can include hives, swelling of the face and throat, difficulty breathing, a fast heartbeat, dizziness, and weakness. These would start a few minutes to a few hours after the vaccination. What should I do?  If you think it is a severe allergic reaction or other emergency that can't wait, call 9-1-1 and get the person to the nearest hospital. Otherwise, call your doctor.  Reactions should be reported to the Vaccine Adverse Event Reporting System (VAERS). Your doctor should file this report, or you can do it yourself through the  VAERS web site at www.vaers.hhs.gov, or by calling 1-800-822-7967. VAERS does not give medical advice. 6. The National Vaccine Injury Compensation Program The National Vaccine Injury Compensation Program (VICP) is a federal program that was created to compensate people who may have been injured by certain vaccines. Persons who believe they may have been injured by a vaccine can learn about the program and about filing a claim by calling 1-800-338-2382 or visiting the VICP website at www.hrsa.gov/vaccinecompensation. There is a time limit to file a claim for compensation. 7. How can I learn more?  Ask your healthcare provider. He or she can give you the vaccine package insert or suggest other sources of information.  Call your local or state health department.  Contact the Centers for Disease Control and Prevention (CDC):  Call 1-800-232-4636 (1-800-CDC-INFO) or  Visit CDC's website at www.cdc.gov/flu Vaccine Information Statement Inactivated Influenza Vaccine (08/22/2013)   This information is not intended to replace advice given to you by your health care provider. Make sure you discuss any questions you have with   your health care provider.   Document Released: 10/27/2005 Document Revised: 01/23/2014 Document Reviewed: 08/25/2013 Elsevier Interactive Patient Education 2016 [employer]. Influenza Virus Vaccine injection What is this medicine? INFLUENZA VIRUS VACCINE (in floo EN zuh VAHY ruhs vak SEEN) helps to reduce the risk of getting influenza also known as the flu. The vaccine only helps protect you against some strains of the flu. This medicine may be used for other purposes; ask your health care provider or pharmacist if you have questions. What should I tell my health care provider before I take this medicine? They need to know if you have any of these conditions: -bleeding disorder like hemophilia -fever or infection -Guillain-Barre syndrome or other neurological  problems -immune system problems -infection with the human immunodeficiency virus (HIV) or AIDS -low blood platelet counts -multiple sclerosis -an unusual or allergic reaction to influenza virus vaccine, latex, other medicines, foods, dyes, or preservatives. Different brands of vaccines contain different allergens. Some may contain latex or eggs. Talk to your doctor about your allergies to make sure that you get the right vaccine. -pregnant or trying to get pregnant -breast-feeding How should I use this medicine? This vaccine is for injection into a muscle or under the skin. It is given by a health care professional. A copy of Vaccine Information Statements will be given before each vaccination. Read this sheet carefully each time. The sheet may change frequently. Talk to your healthcare provider to see which vaccines are right for you. Some vaccines should not be used in all age groups. Overdosage: If you think you have taken too much of this medicine contact a poison control center or emergency room at once. NOTE: This medicine is only for you. Do not share this medicine with others. What if I miss a dose? This does not apply. What may interact with this medicine? -chemotherapy or radiation therapy -medicines that lower your immune system like etanercept, anakinra, infliximab, and adalimumab -medicines that treat or prevent blood clots like warfarin -phenytoin -steroid medicines like prednisone or cortisone -theophylline -vaccines This list may not describe all possible interactions. Give your health care provider a list of all the medicines, herbs, non-prescription drugs, or dietary supplements you use. Also tell them if you smoke, drink alcohol, or use illegal drugs. Some items may interact with your medicine. What should I watch for while using this medicine? Report any side effects that do not go away within 3 days to your doctor or health care professional. Call your health care  provider if any unusual symptoms occur within 6 weeks of receiving this vaccine. You may still catch the flu, but the illness is not usually as bad. You cannot get the flu from the vaccine. The vaccine will not protect against colds or other illnesses that may cause fever. The vaccine is needed every year. What side effects may I notice from receiving this medicine? Side effects that you should report to your doctor or health care professional as soon as possible: -allergic reactions like skin rash, itching or hives, swelling of the face, lips, or tongue Side effects that usually do not require medical attention (report to your doctor or health care professional if they continue or are bothersome): -fever -headache -muscle aches and pains -pain, tenderness, redness, or swelling at the injection site -tiredness This list may not describe all possible side effects. Call your doctor for medical advice about side effects. You may report side effects to FDA at 1-800-FDA-1088. Where should I keep my medicine? The  vaccine will be given by a health care professional in a clinic, pharmacy, doctor's office, or other health care setting. You will not be given vaccine doses to store at home. NOTE: This sheet is a summary. It may not cover all possible information. If you have questions about this medicine, talk to your doctor, pharmacist, or health care provider.    2016, Elsevier/Gold Standard. (2014-07-24 10:07:28)

## 2016-01-21 ENCOUNTER — Ambulatory Visit (INDEPENDENT_AMBULATORY_CARE_PROVIDER_SITE_OTHER): Payer: Medicare HMO | Admitting: Family Medicine

## 2016-01-21 ENCOUNTER — Encounter: Payer: Self-pay | Admitting: Family Medicine

## 2016-01-21 VITALS — BP 103/66 | HR 99 | Temp 98.5°F | Wt 94.0 lb

## 2016-01-21 DIAGNOSIS — L729 Follicular cyst of the skin and subcutaneous tissue, unspecified: Secondary | ICD-10-CM

## 2016-01-21 MED ORDER — SULFAMETHOXAZOLE-TRIMETHOPRIM 800-160 MG PO TABS: 1.0000 | ORAL_TABLET | Freq: Two times a day (BID) | ORAL | 0 refills | Status: DC

## 2016-01-21 NOTE — Progress Notes (Signed)
   BP 103/66   Pulse 99   Temp 98.5 F (36.9 C)   Wt 94 lb (42.6 kg)   SpO2 91%   BMI 18.24 kg/m    Subjective:    Patient ID: Brenda Rocha, female    DOB: 1945-08-13, 71 y.o.   MRN: [id number]  HPI: CHE BLATCH[place]ORD is a 71 y.o. female  Chief Complaint  Patient presents with  . Bump    x 1 week, about the size of a nickle on her back, busted and drained over the weekend. Sore at times.    1 week history of red bump on left shoulder blade that has drained clear fluid and blood twice over the course of the week. Now scabbed over and no longer draining, but still red and inflamed. Denies fever, chills, aches. No known injury or bite to the area. Does have hx of neurofibromatosis.   Relevant past medical, surgical, family and social history reviewed and updated as indicated. Interim medical history since our last visit reviewed. Allergies and medications reviewed and updated.  Review of Systems  Constitutional: Negative.   HENT: Negative.   Respiratory: Negative.   Cardiovascular: Negative.   Gastrointestinal: Negative.   Genitourinary: Negative.   Musculoskeletal: Negative.   Skin:       Cyst on left shoulder blade  Neurological: Negative.   Psychiatric/Behavioral: Negative.     Per HPI unless specifically indicated above     Objective:    BP 103/66   Pulse 99   Temp 98.5 F (36.9 C)   Wt 94 lb (42.6 kg)   SpO2 91%   BMI 18.24 kg/m   Wt Readings from Last 3 Encounters:  01/21/16 94 lb (42.6 kg)  10/07/15 96 lb (43.5 kg)  03/18/15 95 lb (43.1 kg)    Physical Exam  Constitutional: She is oriented to person, place, and time. She appears well-developed and well-nourished.  HENT:  Head: Atraumatic.  Eyes: Conjunctivae are normal. Pupils are equal, round, and reactive to Rocha.  Neck: Normal range of motion. Neck supple.  Cardiovascular: Normal rate.   Pulmonary/Chest: Effort normal. No respiratory distress.  Musculoskeletal: Normal range of motion.    Neurological: She is alert and oriented to person, place, and time.  Skin: Skin is warm and dry. There is erythema.  3 cm non-fluctuant oval shaped cyst on left shoulder blade, scabbed over. Some erythema and edema present  Psychiatric: She has a normal mood and affect. Her behavior is normal.  Nursing note and vitals reviewed.   Results for orders placed or performed in visit on 10/07/15  Lipid Panel Piccolo, [employer]  Result Value Ref Range   Cholesterol Piccolo, Waived 191 <200 mg/dL   HDL Chol Piccolo, Waived 69 >59 mg/dL   Triglycerides Piccolo,Waived 81 <150 mg/dL   Chol/HDL Ratio Piccolo,Waive 2.8 mg/dL   LDL Chol Calc Piccolo Waived 106 (H) <100 mg/dL   VLDL Chol Calc Piccolo,Waive 16 <30 mg/dL      Assessment & Plan:   Problem List Items Addressed This Visit    None    Visit Diagnoses    Cutaneous cyst    -  Primary   Will do 1 week of bactrim in case any infectious component. Dress with vasoline and patch bandage. Monitor closely for resolution       Follow up plan: Return if symptoms worsen or fail to improve.

## 2016-01-21 NOTE — Patient Instructions (Signed)
Follow up as needed

## 2016-03-30 ENCOUNTER — Encounter: Payer: Medicare HMO | Admitting: Family Medicine

## 2016-05-26 ENCOUNTER — Telehealth: Payer: Self-pay | Admitting: Family Medicine

## 2016-05-26 NOTE — Telephone Encounter (Signed)
Called pt to schedule Annual Wellness Visit with Nurse Health Advisor for: 5/17 IN afternoon - knb

## 2016-06-08 ENCOUNTER — Encounter: Payer: Medicare HMO | Admitting: Family Medicine

## 2016-07-06 ENCOUNTER — Encounter: Payer: Medicare HMO | Admitting: Family Medicine

## 2016-08-10 ENCOUNTER — Ambulatory Visit (INDEPENDENT_AMBULATORY_CARE_PROVIDER_SITE_OTHER): Payer: Medicare HMO | Admitting: Family Medicine

## 2016-08-10 ENCOUNTER — Encounter: Payer: Self-pay | Admitting: Family Medicine

## 2016-08-10 VITALS — BP 145/79 | HR 66 | Temp 98.7°F | Ht 61.5 in | Wt 98.2 lb

## 2016-08-10 DIAGNOSIS — J449 Chronic obstructive pulmonary disease, unspecified: Secondary | ICD-10-CM

## 2016-08-10 DIAGNOSIS — E782 Mixed hyperlipidemia: Secondary | ICD-10-CM

## 2016-08-10 DIAGNOSIS — Z720 Tobacco use: Secondary | ICD-10-CM | POA: Diagnosis not present

## 2016-08-10 DIAGNOSIS — Z0000 Encounter for general adult medical examination without abnormal findings: Secondary | ICD-10-CM

## 2016-08-10 MED ORDER — ALBUTEROL SULFATE HFA 108 (90 BASE) MCG/ACT IN AERS: 2.0000 | INHALATION_SPRAY | Freq: Four times a day (QID) | RESPIRATORY_TRACT | 6 refills | Status: DC | PRN

## 2016-08-10 MED ORDER — TIOTROPIUM BROMIDE MONOHYDRATE 18 MCG IN CAPS: 18.0000 ug | ORAL_CAPSULE | Freq: Every day | RESPIRATORY_TRACT | 12 refills | Status: DC

## 2016-08-10 NOTE — Patient Instructions (Addendum)
Preventative Services:  Health Risk Assessment and Personalized Prevention Plan: Done today Bone Mass Measurements: Ordered today Breast Cancer Screening: N/A CVD Screening: Done today Cervical Cancer Screening: N/A Colon Cancer Screening: Ordered today Depression Screening: Done today Diabetes Screening: Done today Glaucoma Screening: See your eye doctor Hepatitis B vaccine: N/A Hepatitis C screening: Refused HIV Screening: Refused Flu Vaccine: Get in October Lung cancer Screening: Not a candidate Obesity Screening: Done today Pneumonia Vaccines (2): up to date STI Screening: N/A    Health Maintenance, Female Adopting a healthy lifestyle and getting preventive care can go a long way to promote health and wellness. Talk with your health care provider about what schedule of regular examinations is right for you. This is a good chance for you to check in with your provider about disease prevention and staying healthy. In between checkups, there are plenty of things you can do on your own. Experts have done a lot of research about which lifestyle changes and preventive measures are most likely to keep you healthy. Ask your health care provider for more information. Weight and diet Eat a healthy diet  Be sure to include plenty of vegetables, fruits, low-fat dairy products, and lean protein.  Do not eat a lot of foods high in solid fats, added sugars, or salt.  Get regular exercise. This is one of the most important things you can do for your health. ? Most adults should exercise for at least 150 minutes each week. The exercise should increase your heart rate and make you sweat (moderate-intensity exercise). ? Most adults should also do strengthening exercises at least twice a week. This is in addition to the moderate-intensity exercise.  Maintain a healthy weight  Body mass index (BMI) is a measurement that can be used to identify possible weight problems. It estimates body fat based  on height and weight. Your health care provider can help determine your BMI and help you achieve or maintain a healthy weight.  For females 53 years of age and older: ? A BMI below 18.5 is considered underweight. ? A BMI of 18.5 to 24.9 is normal. ? A BMI of 25 to 29.9 is considered overweight. ? A BMI of 30 and above is considered obese.  Watch levels of cholesterol and blood lipids  You should start having your blood tested for lipids and cholesterol at 72 years of age, then have this test every 5 years.  You may need to have your cholesterol levels checked more often if: ? Your lipid or cholesterol levels are high. ? You are older than 72 years of age. ? You are at high risk for heart disease.  Cancer screening Lung Cancer  Lung cancer screening is recommended for adults 36-8 years old who are at high risk for lung cancer because of a history of smoking.  A yearly low-dose CT scan of the lungs is recommended for people who: ? Currently smoke. ? Have quit within the past 15 years. ? Have at least a 30-pack-year history of smoking. A pack year is smoking an average of one pack of cigarettes a day for 1 year.  Yearly screening should continue until it has been 15 years since you quit.  Yearly screening should stop if you develop a health problem that would prevent you from having lung cancer treatment.  Breast Cancer  Practice breast self-awareness. This means understanding how your breasts normally appear and feel.  It also means doing regular breast self-exams. Let your health care provider know  about any changes, no matter how small.  If you are in your 20s or 30s, you should have a clinical breast exam (CBE) by a health care provider every 1-3 years as part of a regular health exam.  If you are 66 or older, have a CBE every year. Also consider having a breast X-ray (mammogram) every year.  If you have a family history of breast cancer, talk to your health care provider  about genetic screening.  If you are at high risk for breast cancer, talk to your health care provider about having an MRI and a mammogram every year.  Breast cancer gene (BRCA) assessment is recommended for women who have family members with BRCA-related cancers. BRCA-related cancers include: ? Breast. ? Ovarian. ? Tubal. ? Peritoneal cancers.  Results of the assessment will determine the need for genetic counseling and BRCA1 and BRCA2 testing.  Cervical Cancer Your health care provider may recommend that you be screened regularly for cancer of the pelvic organs (ovaries, uterus, and vagina). This screening involves a pelvic examination, including checking for microscopic changes to the surface of your cervix (Pap test). You may be encouraged to have this screening done every 3 years, beginning at age 59.  For women ages 29-65, health care providers may recommend pelvic exams and Pap testing every 3 years, or they may recommend the Pap and pelvic exam, combined with testing for human papilloma virus (HPV), every 5 years. Some types of HPV increase your risk of cervical cancer. Testing for HPV may also be done on women of any age with unclear Pap test results.  Other health care providers may not recommend any screening for nonpregnant women who are considered low risk for pelvic cancer and who do not have symptoms. Ask your health care provider if a screening pelvic exam is right for you.  If you have had past treatment for cervical cancer or a condition that could lead to cancer, you need Pap tests and screening for cancer for at least 20 years after your treatment. If Pap tests have been discontinued, your risk factors (such as having a new sexual partner) need to be reassessed to determine if screening should resume. Some women have medical problems that increase the chance of getting cervical cancer. In these cases, your health care provider may recommend more frequent screening and Pap  tests.  Colorectal Cancer  This type of cancer can be detected and often prevented.  Routine colorectal cancer screening usually begins at 72 years of age and continues through 72 years of age.  Your health care provider may recommend screening at an earlier age if you have risk factors for colon cancer.  Your health care provider may also recommend using home test kits to check for hidden blood in the stool.  A small camera at the end of a tube can be used to examine your colon directly (sigmoidoscopy or colonoscopy). This is done to check for the earliest forms of colorectal cancer.  Routine screening usually begins at age 63.  Direct examination of the colon should be repeated every 5-10 years through 72 years of age. However, you may need to be screened more often if early forms of precancerous polyps or small growths are found.  Skin Cancer  Check your skin from head to toe regularly.  Tell your health care provider about any new moles or changes in moles, especially if there is a change in a mole's shape or color.  Also tell your health care  provider if you have a mole that is larger than the size of a pencil eraser.  Always use sunscreen. Apply sunscreen liberally and repeatedly throughout the day.  Protect yourself by wearing long sleeves, pants, a wide-brimmed hat, and sunglasses whenever you are outside.  Heart disease, diabetes, and high blood pressure  High blood pressure causes heart disease and increases the risk of stroke. High blood pressure is more likely to develop in: ? People who have blood pressure in the high end of the normal range (130-139/85-89 mm Hg). ? People who are overweight or obese. ? People who are African American.  If you are 22-50 years of age, have your blood pressure checked every 3-5 years. If you are 1 years of age or older, have your blood pressure checked every year. You should have your blood pressure measured twice-once when you are at  a hospital or clinic, and once when you are not at a hospital or clinic. Record the average of the two measurements. To check your blood pressure when you are not at a hospital or clinic, you can use: ? An automated blood pressure machine at a pharmacy. ? A home blood pressure monitor.  If you are between 21 years and 49 years old, ask your health care provider if you should take aspirin to prevent strokes.  Have regular diabetes screenings. This involves taking a blood sample to check your fasting blood sugar level. ? If you are at a normal weight and have a low risk for diabetes, have this test once every three years after 72 years of age. ? If you are overweight and have a high risk for diabetes, consider being tested at a younger age or more often. Preventing infection Hepatitis B  If you have a higher risk for hepatitis B, you should be screened for this virus. You are considered at high risk for hepatitis B if: ? You were born in a country where hepatitis B is common. Ask your health care provider which countries are considered high risk. ? Your parents were born in a high-risk country, and you have not been immunized against hepatitis B (hepatitis B vaccine). ? You have HIV or AIDS. ? You use needles to inject street drugs. ? You live with someone who has hepatitis B. ? You have had sex with someone who has hepatitis B. ? You get hemodialysis treatment. ? You take certain medicines for conditions, including cancer, organ transplantation, and autoimmune conditions.  Hepatitis C  Blood testing is recommended for: ? Everyone born from [birth year] through 1966. ? Anyone with known risk factors for hepatitis C.  Sexually transmitted infections (STIs)  You should be screened for sexually transmitted infections (STIs) including gonorrhea and chlamydia if: ? You are sexually active and are younger than 72 years of age. ? You are older than 72 years of age and your health care provider tells  you that you are at risk for this type of infection. ? Your sexual activity has changed since you were last screened and you are at an increased risk for chlamydia or gonorrhea. Ask your health care provider if you are at risk.  If you do not have HIV, but are at risk, it may be recommended that you take a prescription medicine daily to prevent HIV infection. This is called pre-exposure prophylaxis (PrEP). You are considered at risk if: ? You are sexually active and do not regularly use condoms or know the HIV status of your partner(s). ? You take  drugs by injection. ? You are sexually active with a partner who has HIV.  Talk with your health care provider about whether you are at high risk of being infected with HIV. If you choose to begin PrEP, you should first be tested for HIV. You should then be tested every 3 months for as long as you are taking PrEP. Pregnancy  If you are premenopausal and you may become pregnant, ask your health care provider about preconception counseling.  If you may become pregnant, take 400 to 800 micrograms (mcg) of folic acid every day.  If you want to prevent pregnancy, talk to your health care provider about birth control (contraception). Osteoporosis and menopause  Osteoporosis is a disease in which the bones lose minerals and strength with aging. This can result in serious bone fractures. Your risk for osteoporosis can be identified using a bone density scan.  If you are 75 years of age or older, or if you are at risk for osteoporosis and fractures, ask your health care provider if you should be screened.  Ask your health care provider whether you should take a calcium or vitamin D supplement to lower your risk for osteoporosis.  Menopause may have certain physical symptoms and risks.  Hormone replacement therapy may reduce some of these symptoms and risks. Talk to your health care provider about whether hormone replacement therapy is right for  you. Follow these instructions at home:  Schedule regular health, dental, and eye exams.  Stay current with your immunizations.  Do not use any tobacco products including cigarettes, chewing tobacco, or electronic cigarettes.  If you are pregnant, do not drink alcohol.  If you are breastfeeding, limit how much and how often you drink alcohol.  Limit alcohol intake to no more than 1 drink per day for nonpregnant women. One drink equals 12 ounces of beer, 5 ounces of wine, or 1 ounces of hard liquor.  Do not use street drugs.  Do not share needles.  Ask your health care provider for help if you need support or information about quitting drugs.  Tell your health care provider if you often feel depressed.  Tell your health care provider if you have ever been abused or do not feel safe at home. This information is not intended to replace advice given to you by your health care provider. Make sure you discuss any questions you have with your health care provider. Document Released: 07/18/2010 Document Revised: 06/10/2015 Document Reviewed: 10/06/2014 Elsevier Interactive Patient Education  2018 [place] Maintenance for Postmenopausal Women Menopause is a normal process in which your reproductive ability comes to an end. This process happens gradually over a span of months to years, usually between the ages of 10 and 14. Menopause is complete when you have missed 12 consecutive menstrual periods. It is important to talk with your health care provider about some of the most common conditions that affect postmenopausal women, such as heart disease, cancer, and bone loss (osteoporosis). Adopting a healthy lifestyle and getting preventive care can help to promote your health and wellness. Those actions can also lower your chances of developing some of these common conditions. What should I know about menopause? During menopause, you may experience a number of symptoms, such  as:  Moderate-to-severe hot flashes.  Night sweats.  Decrease in sex drive.  Mood swings.  Headaches.  Tiredness.  Irritability.  Memory problems.  Insomnia.  Choosing to treat or not to treat menopausal changes is an individual  decision that you make with your health care provider. What should I know about hormone replacement therapy and supplements? Hormone therapy products are effective for treating symptoms that are associated with menopause, such as hot flashes and night sweats. Hormone replacement carries certain risks, especially as you become older. If you are thinking about using estrogen or estrogen with progestin treatments, discuss the benefits and risks with your health care provider. What should I know about heart disease and stroke? Heart disease, heart attack, and stroke become more likely as you age. This may be due, in part, to the hormonal changes that your body experiences during menopause. These can affect how your body processes dietary fats, triglycerides, and cholesterol. Heart attack and stroke are both medical emergencies. There are many things that you can do to help prevent heart disease and stroke:  Have your blood pressure checked at least every 1-2 years. High blood pressure causes heart disease and increases the risk of stroke.  If you are 64-52 years old, ask your health care provider if you should take aspirin to prevent a heart attack or a stroke.  Do not use any tobacco products, including cigarettes, chewing tobacco, or electronic cigarettes. If you need help quitting, ask your health care provider.  It is important to eat a healthy diet and maintain a healthy weight. ? Be sure to include plenty of vegetables, fruits, low-fat dairy products, and lean protein. ? Avoid eating foods that are high in solid fats, added sugars, or salt (sodium).  Get regular exercise. This is one of the most important things that you can do for your health. ? Try  to exercise for at least 150 minutes each week. The type of exercise that you do should increase your heart rate and make you sweat. This is known as moderate-intensity exercise. ? Try to do strengthening exercises at least twice each week. Do these in addition to the moderate-intensity exercise.  Know your numbers.Ask your health care provider to check your cholesterol and your blood glucose. Continue to have your blood tested as directed by your health care provider.  What should I know about cancer screening? There are several types of cancer. Take the following steps to reduce your risk and to catch any cancer development as early as possible. Breast Cancer  Practice breast self-awareness. ? This means understanding how your breasts normally appear and feel. ? It also means doing regular breast self-exams. Let your health care provider know about any changes, no matter how small.  If you are 3 or older, have a clinician do a breast exam (clinical breast exam or CBE) every year. Depending on your age, family history, and medical history, it may be recommended that you also have a yearly breast X-ray (mammogram).  If you have a family history of breast cancer, talk with your health care provider about genetic screening.  If you are at high risk for breast cancer, talk with your health care provider about having an MRI and a mammogram every year.  Breast cancer (BRCA) gene test is recommended for women who have family members with BRCA-related cancers. Results of the assessment will determine the need for genetic counseling and BRCA1 and for BRCA2 testing. BRCA-related cancers include these types: ? Breast. This occurs in males or females. ? Ovarian. ? Tubal. This may also be called fallopian tube cancer. ? Cancer of the abdominal or pelvic lining (peritoneal cancer). ? Prostate. ? Pancreatic.  Cervical, Uterine, and Ovarian Cancer Your health care provider  may recommend that you be  screened regularly for cancer of the pelvic organs. These include your ovaries, uterus, and vagina. This screening involves a pelvic exam, which includes checking for microscopic changes to the surface of your cervix (Pap test).  For women ages 21-65, health care providers may recommend a pelvic exam and a Pap test every three years. For women ages 20-65, they may recommend the Pap test and pelvic exam, combined with testing for human papilloma virus (HPV), every five years. Some types of HPV increase your risk of cervical cancer. Testing for HPV may also be done on women of any age who have unclear Pap test results.  Other health care providers may not recommend any screening for nonpregnant women who are considered low risk for pelvic cancer and have no symptoms. Ask your health care provider if a screening pelvic exam is right for you.  If you have had past treatment for cervical cancer or a condition that could lead to cancer, you need Pap tests and screening for cancer for at least 20 years after your treatment. If Pap tests have been discontinued for you, your risk factors (such as having a new sexual partner) need to be reassessed to determine if you should start having screenings again. Some women have medical problems that increase the chance of getting cervical cancer. In these cases, your health care provider may recommend that you have screening and Pap tests more often.  If you have a family history of uterine cancer or ovarian cancer, talk with your health care provider about genetic screening.  If you have vaginal bleeding after reaching menopause, tell your health care provider.  There are currently no reliable tests available to screen for ovarian cancer.  Lung Cancer Lung cancer screening is recommended for adults 74-36 years old who are at high risk for lung cancer because of a history of smoking. A yearly low-dose CT scan of the lungs is recommended if you:  Currently  smoke.  Have a history of at least 30 pack-years of smoking and you currently smoke or have quit within the past 15 years. A pack-year is smoking an average of one pack of cigarettes per day for one year.  Yearly screening should:  Continue until it has been 15 years since you quit.  Stop if you develop a health problem that would prevent you from having lung cancer treatment.  Colorectal Cancer  This type of cancer can be detected and can often be prevented.  Routine colorectal cancer screening usually begins at age 62 and continues through age 85.  If you have risk factors for colon cancer, your health care provider may recommend that you be screened at an earlier age.  If you have a family history of colorectal cancer, talk with your health care provider about genetic screening.  Your health care provider may also recommend using home test kits to check for hidden blood in your stool.  A small camera at the end of a tube can be used to examine your colon directly (sigmoidoscopy or colonoscopy). This is done to check for the earliest forms of colorectal cancer.  Direct examination of the colon should be repeated every 5-10 years until age 5. However, if early forms of precancerous polyps or small growths are found or if you have a family history or genetic risk for colorectal cancer, you may need to be screened more often.  Skin Cancer  Check your skin from head to toe regularly.  Monitor any  moles. Be sure to tell your health care provider: ? About any new moles or changes in moles, especially if there is a change in a mole's shape or color. ? If you have a mole that is larger than the size of a pencil eraser.  If any of your family members has a history of skin cancer, especially at a young age, talk with your health care provider about genetic screening.  Always use sunscreen. Apply sunscreen liberally and repeatedly throughout the day.  Whenever you are outside, protect  yourself by wearing long sleeves, pants, a wide-brimmed hat, and sunglasses.  What should I know about osteoporosis? Osteoporosis is a condition in which bone destruction happens more quickly than new bone creation. After menopause, you may be at an increased risk for osteoporosis. To help prevent osteoporosis or the bone fractures that can happen because of osteoporosis, the following is recommended:  If you are 33-24 years old, get at least 1,000 mg of calcium and at least 600 mg of vitamin D per day.  If you are older than age 31 but younger than age 25, get at least 1,200 mg of calcium and at least 600 mg of vitamin D per day.  If you are older than age 88, get at least 1,200 mg of calcium and at least 800 mg of vitamin D per day.  Smoking and excessive alcohol intake increase the risk of osteoporosis. Eat foods that are rich in calcium and vitamin D, and do weight-bearing exercises several times each week as directed by your health care provider. What should I know about how menopause affects my mental health? Depression may occur at any age, but it is more common as you become older. Common symptoms of depression include:  Low or sad mood.  Changes in sleep patterns.  Changes in appetite or eating patterns.  Feeling an overall lack of motivation or enjoyment of activities that you previously enjoyed.  Frequent crying spells.  Talk with your health care provider if you think that you are experiencing depression. What should I know about immunizations? It is important that you get and maintain your immunizations. These include:  Tetanus, diphtheria, and pertussis (Tdap) booster vaccine.  Influenza every year before the flu season begins.  Pneumonia vaccine.  Shingles vaccine.  Your health care provider may also recommend other immunizations. This information is not intended to replace advice given to you by your health care provider. Make sure you discuss any questions you  have with your health care provider. Document Released: 02/24/2005 Document Revised: 07/23/2015 Document Reviewed: 10/06/2014 Elsevier Interactive Patient Education  2018 [employer].

## 2016-08-10 NOTE — Assessment & Plan Note (Signed)
Rechecking levels today. Await results. Treat as needed.  

## 2016-08-10 NOTE — Assessment & Plan Note (Addendum)
Smoked about pack a week since she was in her late 83s- not interested in lung cancer screening at this time. Encouraged patient to quit smoking.

## 2016-08-10 NOTE — Progress Notes (Signed)
BP (!) 145/79 (BP Location: Left Arm, Patient Position: Sitting, Cuff Size: Small)   Pulse 66   Temp 98.7 F (37.1 C)   Ht 5' 1.5" (1.562 m)   Wt 98 lb 3 oz (44.5 kg)   SpO2 94%   BMI 18.25 kg/m    Subjective:    Patient ID: Brenda Rocha, female    DOB: [birth year], 71 y.o.   MRN: [id number]  HPI: Brenda Rocha is a 71 y.o. female presenting on 08/10/2016 for comprehensive medical examination. Current medical complaints include:  COPD COPD status: controlled Satisfied with current treatment?: yes Oxygen use: no Dyspnea frequency: none Cough frequency: none Rescue inhaler frequency: 3 times in a year   Limitation of activity: no Productive cough: none Pneumovax: Up to Date Influenza: Up to Date  HYPERLIPIDEMIA Hyperlipidemia status: excellent compliance Satisfied with current treatment?  yes Side effects:  Not on anything Past cholesterol meds: none Supplements: none Aspirin:  no The 10-year ASCVD risk score Mikey Bussing DC Jr., et al., 2013) is: 10.6%   Values used to calculate the score:     Age: 49 years     Sex: Female     Is Non-Hispanic African American: No     Diabetic: No     Tobacco smoker: Yes     Systolic Blood Pressure: 564 mmHg     Is BP treated: No     HDL Cholesterol: 69 mg/dL     Total Cholesterol: 191 mg/dL Chest pain:  no  Menopausal Symptoms: no  Functional Status Survey: Is the patient deaf or have difficulty hearing?: No Does the patient have difficulty seeing, even when wearing glasses/contacts?: No Does the patient have difficulty concentrating, remembering, or making decisions?: No Does the patient have difficulty walking or climbing stairs?: No Does the patient have difficulty dressing or bathing?: No Does the patient have difficulty doing errands alone such as visiting a doctor's office or shopping?: No  Fall Risk  08/10/2016 03/18/2015  Falls in the past year? Yes No  Number falls in past yr: 2 or more -  Injury with Fall? No -    Depression Screen Depression screen [facility] 2/9 08/10/2016 03/18/2015  Decreased Interest 0 0  Down, Depressed, Hopeless 0 0  PHQ - 2 Score 0 0  Altered sleeping 0 -  Tired, decreased energy 0 -  Change in appetite 0 -  Feeling bad or failure about yourself  0 -  Trouble concentrating 0 -  Moving slowly or fidgety/restless 0 -  Suicidal thoughts 0 -  PHQ-9 Score 0 -   Advanced Directives Does patient have a HCPOA?    yes Does patient have a living will or MOST form?  yes  Past Medical History:  Past Medical History:  Diagnosis Date  . Cancer ([place])    breast  . COPD (chronic obstructive pulmonary disease) ([place])   . Neurofibromatosis [facility])     Surgical History:  Past Surgical History:  Procedure Laterality Date  . ABDOMINAL HYSTERECTOMY    . BREAST SURGERY     mastectomy  . CHOLECYSTECTOMY    . MASTECTOMY Bilateral     Medications:  No current outpatient prescriptions on file prior to visit.   No current facility-administered medications on file prior to visit.     Allergies:  No Known Allergies  Social History:  Social History   Social History  . Marital status: Divorced    Spouse name: N/A  . Number of children: N/A  .  Years of education: N/A   Occupational History  . Not on file.   Social History Main Topics  . Smoking status: Current Every Day Smoker    Packs/day: 0.25    Types: Cigarettes  . Smokeless tobacco: Never Used  . Alcohol use 0.0 oz/week     Comment: on occasion  . Drug use: No  . Sexual activity: No   Other Topics Concern  . Not on file   Social History Narrative  . No narrative on file   History  Smoking Status  . Current Every Day Smoker  . Packs/day: 0.25  . Types: Cigarettes  Smokeless Tobacco  . Never Used   History  Alcohol Use  . 0.0 oz/week    Comment: on occasion    Family History:  Family History  Problem Relation Age of Onset  . Cancer Mother        breast  . Heart disease Mother   . Thyroid disease  Mother   . Hypertension Mother   . Osteoporosis Mother   . Hypertension Sister   . Hypertension Brother   . Cancer Maternal Grandmother        breast , bone    Past medical history, surgical history, medications, allergies, family history and social history reviewed with patient today and changes made to appropriate areas of the chart.   Review of Systems  Constitutional: Negative.   HENT: Negative.   Eyes: Negative.   Respiratory: Negative.   Cardiovascular: Negative.   Gastrointestinal: Positive for heartburn (with eating). Negative for abdominal pain, blood in stool, constipation, diarrhea, melena, nausea and vomiting.  Genitourinary: Negative.   Musculoskeletal: Negative.   Skin: Negative.   Neurological: Negative.   Endo/Heme/Allergies: Positive for environmental allergies. Negative for polydipsia. Does not bruise/bleed easily.  Psychiatric/Behavioral: Negative.     All other ROS negative except what is listed above and in the HPI.      Objective:    BP (!) 145/79 (BP Location: Left Arm, Patient Position: Sitting, Cuff Size: Small)   Pulse 66   Temp 98.7 F (37.1 C)   Ht 5' 1.5" (1.562 m)   Wt 98 lb 3 oz (44.5 kg)   SpO2 94%   BMI 18.25 kg/m   Wt Readings from Last 3 Encounters:  08/10/16 98 lb 3 oz (44.5 kg)  01/21/16 94 lb (42.6 kg)  10/07/15 96 lb (43.5 kg)    Physical Exam  Constitutional: She is oriented to person, place, and time. She appears well-developed and well-nourished. No distress.  HENT:  Head: Normocephalic and atraumatic.  Right Ear: Hearing, tympanic membrane, external ear and ear canal normal.  Left Ear: Hearing, tympanic membrane, external ear and ear canal normal.  Nose: Nose normal.  Mouth/Throat: Uvula is midline, oropharynx is clear and moist and mucous membranes are normal. No oropharyngeal exudate.  Eyes: Pupils are equal, round, and reactive to Rocha. Conjunctivae, EOM and lids are normal. Right eye exhibits no discharge. Left eye  exhibits no discharge. No scleral icterus.  Neck: Normal range of motion. Neck supple. No JVD present. No tracheal deviation present. No thyromegaly present.  Cardiovascular: Normal rate, regular rhythm, normal heart sounds and intact distal pulses.  Exam reveals no gallop and no friction rub.   No murmur heard. Pulmonary/Chest: Effort normal and breath sounds normal. No stridor. No respiratory distress. She has no wheezes. She has no rales. She exhibits no tenderness.  Abdominal: Soft. Bowel sounds are normal. She exhibits no distension and no mass.  There is no tenderness. There is no rebound and no guarding.  Genitourinary:  Genitourinary Comments: Deferred at patient request  Musculoskeletal: Normal range of motion. She exhibits no edema, tenderness or deformity.  Lymphadenopathy:    She has no cervical adenopathy.  Neurological: She is alert and oriented to person, place, and time. She has normal reflexes. She displays normal reflexes. No cranial nerve deficit. She exhibits normal muscle tone. Coordination normal.  Skin: Skin is warm, dry and intact. No rash noted. She is not diaphoretic. No erythema. No pallor.  Too numerous to count neurofibromatosis  Psychiatric: She has a normal mood and affect. Her speech is normal and behavior is normal. Judgment and thought content normal. Cognition and memory are normal.  Nursing note and vitals reviewed.   6CIT Screen 08/10/2016  What Year? 0 points  What month? 0 points  What time? 0 points  Count back from 20 0 points  Months in reverse 0 points  Repeat phrase 4 points  Total Score 4     Results for orders placed or performed in visit on 10/07/15  Lipid Panel Piccolo, [employer]  Result Value Ref Range   Cholesterol Piccolo, Waived 191 <200 mg/dL   HDL Chol Piccolo, Waived 69 >59 mg/dL   Triglycerides Piccolo,Waived 81 <150 mg/dL   Chol/HDL Ratio Piccolo,Waive 2.8 mg/dL   LDL Chol Calc Piccolo Waived 106 (H) <100 mg/dL   VLDL Chol Calc  Piccolo,Waive 16 <30 mg/dL      Assessment & Plan:   Problem List Items Addressed This Visit      Respiratory   COLD (chronic obstructive lung disease) (HCC)    Stable on current regimen. No concerns. Continue current regimen. Refills given.       Relevant Medications   tiotropium (SPIRIVA HANDIHALER) 18 MCG inhalation capsule   albuterol (PROVENTIL HFA;VENTOLIN HFA) 108 (90 Base) MCG/ACT inhaler   Other Relevant Orders   CBC with Differential/Platelet   UA/M w/rflx Culture, Routine     Other   Hyperlipidemia    Rechecking levels today. Await results. Treat as needed.       Relevant Orders   Comprehensive metabolic panel   Lipid Panel w/o Chol/HDL Ratio   TSH   Tobacco abuse    Smoked about pack a week since she was in her late 54s- not interested in lung cancer screening at this time. Encouraged patient to quit smoking.       Relevant Orders   CBC with Differential/Platelet   TSH   UA/M w/rflx Culture, Routine    Other Visit Diagnoses    Medicare annual wellness visit, subsequent    -  Primary   Preventative care discussed as below. Call with any concerns.    Routine general medical examination at a health care facility       Vaccines up to date. Screening labs checked today. Pap and mammogram N/A. Colon due in October. DEXA due. Lung cancer screening N/A.       Preventative Services:  Health Risk Assessment and Personalized Prevention Plan: Done today Bone Mass Measurements: Ordered today Breast Cancer Screening: N/A CVD Screening: Done today Cervical Cancer Screening: N/A Colon Cancer Screening: Ordered today Depression Screening: Done today Diabetes Screening: Done today Glaucoma Screening: See your eye doctor Hepatitis B vaccine: N/A Hepatitis C screening: Refused HIV Screening: Refused Flu Vaccine: Get in October Lung cancer Screening: Not a candidate Obesity Screening: Done today Pneumonia Vaccines (2): up to date STI Screening: N/A  Follow up  plan:  Return in about 6 months (around 02/10/2017) for Follow up COPD/cholesterol.  LABORATORY TESTING:  - Pap smear: not applicable  IMMUNIZATIONS:   - Tdap: Tetanus vaccination status reviewed: last tetanus booster within 10 years. - Influenza: Postponed to flu season - Pneumovax: Up to date - Prevnar: Up to date - Zostavax vaccine: Will check with insurance  SCREENING: -Mammogram: Not applicable  - Colonoscopy: Ordered today  - Bone Density: Ordered today   PATIENT COUNSELING:   Advised to take 1 mg of folate supplement per day if capable of pregnancy.   Sexuality: Discussed sexually transmitted diseases, partner selection, use of condoms, avoidance of unintended pregnancy  and contraceptive alternatives.   Advised to avoid cigarette smoking.  I discussed with the patient that most people either abstain from alcohol or drink within safe limits (<=14/week and <=4 drinks/occasion for males, <=7/weeks and <= 3 drinks/occasion for females) and that the risk for alcohol disorders and other health effects rises proportionally with the number of drinks per week and how often a drinker exceeds daily limits.  Discussed cessation/primary prevention of drug use and availability of treatment for abuse.   Diet: Encouraged to adjust caloric intake to maintain  or achieve ideal body weight, to reduce intake of dietary saturated fat and total fat, to limit sodium intake by avoiding high sodium foods and not adding table salt, and to maintain adequate dietary potassium and calcium preferably from fresh fruits, vegetables, and low-fat dairy products.    stressed the importance of regular exercise  Injury prevention: Discussed safety belts, safety helmets, smoke detector, smoking near bedding or upholstery.   Dental health: Discussed importance of regular tooth brushing, flossing, and dental visits.    NEXT PREVENTATIVE PHYSICAL DUE IN 1 YEAR. Return in about 6 months (around 02/10/2017) for  Follow up COPD/cholesterol.

## 2016-08-10 NOTE — Assessment & Plan Note (Signed)
Stable on current regimen. No concerns. Continue current regimen. Refills given.

## 2016-08-11 ENCOUNTER — Encounter: Payer: Self-pay | Admitting: Family Medicine

## 2016-08-11 LAB — CBC WITH DIFFERENTIAL/PLATELET
BASOS: 0 %
Basophils Absolute: 0 10*3/uL (ref 0.0–0.2)
EOS (ABSOLUTE): 0.1 10*3/uL (ref 0.0–0.4)
Eos: 1 %
Hematocrit: 46.5 % (ref 34.0–46.6)
Hemoglobin: 15.2 g/dL (ref 11.1–15.9)
IMMATURE GRANS (ABS): 0 10*3/uL (ref 0.0–0.1)
IMMATURE GRANULOCYTES: 0 %
Lymphocytes Absolute: 1.5 10*3/uL (ref 0.7–3.1)
Lymphs: 19 %
MCH: 30 pg (ref 26.6–33.0)
MCHC: 32.7 g/dL (ref 31.5–35.7)
MCV: 92 fL (ref 79–97)
Monocytes Absolute: 0.6 10*3/uL (ref 0.1–0.9)
Monocytes: 7 %
NEUTROS ABS: 5.5 10*3/uL (ref 1.4–7.0)
NEUTROS PCT: 73 %
Platelets: 251 10*3/uL (ref 150–379)
RBC: 5.06 x10E6/uL (ref 3.77–5.28)
RDW: 14.6 % (ref 12.3–15.4)
WBC: 7.7 10*3/uL (ref 3.4–10.8)

## 2016-08-11 LAB — COMPREHENSIVE METABOLIC PANEL
A/G RATIO: 2 (ref 1.2–2.2)
ALBUMIN: 4.5 g/dL (ref 3.5–4.8)
ALT: 9 IU/L (ref 0–32)
AST: 15 IU/L (ref 0–40)
Alkaline Phosphatase: 76 IU/L (ref 39–117)
BUN / CREAT RATIO: 21 (ref 12–28)
BUN: 16 mg/dL (ref 8–27)
Bilirubin Total: 0.3 mg/dL (ref 0.0–1.2)
CALCIUM: 8.9 mg/dL (ref 8.7–10.3)
CO2: 22 mmol/L (ref 20–29)
Chloride: 104 mmol/L (ref 96–106)
Creatinine, Ser: 0.77 mg/dL (ref 0.57–1.00)
GFR, EST AFRICAN AMERICAN: 90 mL/min/{1.73_m2} (ref >59)
GFR, EST NON AFRICAN AMERICAN: 78 mL/min/{1.73_m2} (ref >59)
GLOBULIN, TOTAL: 2.2 g/dL (ref 1.5–4.5)
Glucose: 92 mg/dL (ref 65–99)
POTASSIUM: 4.7 mmol/L (ref 3.5–5.2)
SODIUM: 142 mmol/L (ref 134–144)
TOTAL PROTEIN: 6.7 g/dL (ref 6.0–8.5)

## 2016-08-11 LAB — LIPID PANEL W/O CHOL/HDL RATIO
Cholesterol, Total: 205 mg/dL — ABNORMAL HIGH (ref 100–199)
HDL: 73 mg/dL (ref >39)
LDL Calculated: 108 mg/dL — ABNORMAL HIGH (ref 0–99)
Triglycerides: 119 mg/dL (ref 0–149)
VLDL Cholesterol Cal: 24 mg/dL (ref 5–40)

## 2016-08-11 LAB — TSH: TSH: 0.929 u[IU]/mL (ref 0.450–4.500)

## 2016-08-15 LAB — UA/M W/RFLX CULTURE, ROUTINE
Bilirubin, UA: NEGATIVE
Glucose, UA: NEGATIVE
KETONES UA: NEGATIVE
NITRITE UA: NEGATIVE
PH UA: 6 (ref 5.0–7.5)
Protein, UA: NEGATIVE
RBC, UA: NEGATIVE
SPEC GRAV UA: 1.025 (ref 1.005–1.030)
Urobilinogen, Ur: 0.2 mg/dL (ref 0.2–1.0)

## 2016-08-15 LAB — MICROSCOPIC EXAMINATION: BACTERIA UA: NONE SEEN

## 2016-09-07 ENCOUNTER — Ambulatory Visit
Admission: RE | Admit: 2016-09-07 | Discharge: 2016-09-07 | Disposition: A | Discharge status: Home / Self Care | Payer: Medicare HMO | Source: Ambulatory Visit | Attending: Family Medicine | Admitting: Family Medicine

## 2016-09-07 DIAGNOSIS — Z13820 Encounter for screening for osteoporosis: Secondary | ICD-10-CM | POA: Insufficient documentation

## 2016-09-07 DIAGNOSIS — M810 Age-related osteoporosis without current pathological fracture: Secondary | ICD-10-CM | POA: Diagnosis not present

## 2016-09-08 ENCOUNTER — Telehealth: Payer: Self-pay | Admitting: Family Medicine

## 2016-09-08 ENCOUNTER — Encounter: Payer: Self-pay | Admitting: Family Medicine

## 2016-09-08 DIAGNOSIS — M810 Age-related osteoporosis without current pathological fracture: Secondary | ICD-10-CM

## 2016-09-08 NOTE — Telephone Encounter (Signed)
Attempted to call patient to give her her results. Phone number lead to VM that was identified as "[facility]". No VM left.   Please see if there is another number we can attempt to call patient at.   Her bone density came back very very osteoporotic. I'd like her to see the rheumatologist to try to get her bones stronger. Referral generated today. Thanks!

## 2016-09-11 NOTE — Telephone Encounter (Signed)
Patient called requesting to speak with Tiffany.  She also wanted the referral information to Rheumatology. I gave her the information she requested for rheumatology.  [phone]  Thank you

## 2016-09-11 NOTE — Telephone Encounter (Signed)
Patient notified

## 2016-09-11 NOTE — Telephone Encounter (Signed)
Spoke with patient, let her know that the only reason she is going to Rheumatology is due to osteoporosis.

## 2016-10-07 ENCOUNTER — Encounter: Payer: Self-pay | Admitting: Emergency Medicine

## 2016-10-07 ENCOUNTER — Emergency Department
Admission: EM | Admit: 2016-10-07 | Discharge: 2016-10-07 | Disposition: A | Discharge status: Home / Self Care | Payer: Medicare HMO | Attending: Emergency Medicine | Admitting: Emergency Medicine

## 2016-10-07 ENCOUNTER — Emergency Department: Payer: Medicare HMO

## 2016-10-07 DIAGNOSIS — N200 Calculus of kidney: Secondary | ICD-10-CM | POA: Diagnosis not present

## 2016-10-07 DIAGNOSIS — Z79899 Other long term (current) drug therapy: Secondary | ICD-10-CM | POA: Insufficient documentation

## 2016-10-07 DIAGNOSIS — F17210 Nicotine dependence, cigarettes, uncomplicated: Secondary | ICD-10-CM | POA: Insufficient documentation

## 2016-10-07 DIAGNOSIS — R109 Unspecified abdominal pain: Secondary | ICD-10-CM | POA: Insufficient documentation

## 2016-10-07 DIAGNOSIS — Z853 Personal history of malignant neoplasm of breast: Secondary | ICD-10-CM | POA: Insufficient documentation

## 2016-10-07 DIAGNOSIS — J449 Chronic obstructive pulmonary disease, unspecified: Secondary | ICD-10-CM | POA: Insufficient documentation

## 2016-10-07 DIAGNOSIS — K5730 Diverticulosis of large intestine without perforation or abscess without bleeding: Secondary | ICD-10-CM | POA: Diagnosis not present

## 2016-10-07 LAB — URINALYSIS, COMPLETE (UACMP) WITH MICROSCOPIC
BACTERIA UA: NONE SEEN
BILIRUBIN URINE: NEGATIVE
Glucose, UA: NEGATIVE mg/dL
Hgb urine dipstick: NEGATIVE
KETONES UR: NEGATIVE mg/dL
LEUKOCYTES UA: NEGATIVE
Nitrite: NEGATIVE
PH: 5 (ref 5.0–8.0)
Protein, ur: NEGATIVE mg/dL
Specific Gravity, Urine: 1.016 (ref 1.005–1.030)

## 2016-10-07 LAB — CBC
HCT: 43.8 % (ref 35.0–47.0)
Hemoglobin: 14.9 g/dL (ref 12.0–16.0)
MCH: 30.3 pg (ref 26.0–34.0)
MCHC: 34.1 g/dL (ref 32.0–36.0)
MCV: 88.8 fL (ref 80.0–100.0)
PLATELETS: 258 10*3/uL (ref 150–440)
RBC: 4.93 MIL/uL (ref 3.80–5.20)
RDW: 14.6 % — ABNORMAL HIGH (ref 11.5–14.5)
WBC: 10.1 10*3/uL (ref 3.6–11.0)

## 2016-10-07 LAB — LIPASE, BLOOD: Lipase: 23 U/L (ref 11–51)

## 2016-10-07 MED ORDER — OXYCODONE-ACETAMINOPHEN 5-325 MG PO TABS
1.0000 | ORAL_TABLET | Freq: Once | ORAL | Status: AC
  Administered 2016-10-07: 1 via ORAL
  Filled 2016-10-07: qty 1

## 2016-10-07 MED ORDER — IBUPROFEN 600 MG PO TABS
600.0000 mg | ORAL_TABLET | Freq: Once | ORAL | Status: AC
  Administered 2016-10-07: 600 mg via ORAL
  Filled 2016-10-07: qty 1

## 2016-10-07 MED ORDER — ONDANSETRON 4 MG PO TBDP: 4.0000 mg | ORAL_TABLET | Freq: Three times a day (TID) | ORAL | 0 refills | Status: DC | PRN

## 2016-10-07 MED ORDER — IBUPROFEN 600 MG PO TABS: 600.0000 mg | ORAL_TABLET | Freq: Three times a day (TID) | ORAL | 0 refills | Status: DC | PRN

## 2016-10-07 MED ORDER — OXYCODONE-ACETAMINOPHEN 5-325 MG PO TABS: 1.0000 | ORAL_TABLET | Freq: Four times a day (QID) | ORAL | 0 refills | Status: DC | PRN

## 2016-10-07 NOTE — Discharge Instructions (Signed)
Please take your pain medication as needed for severe symptoms and return to the emergency department for any concerns whatsoever  The most dangerous thing that can happen with a kidney stone is free to get a urine infection. If you develop a fever you must return immediately to the emergency department. Otherwise follow up with your primary care physician this coming Monday for recheck.  It was a pleasure to take care of you today, and thank you for coming to our emergency department.  If you have any questions or concerns before leaving please ask the nurse to grab me and I'm more than happy to go through your aftercare instructions again.  If you were prescribed any opioid pain medication today such as Norco, Vicodin, Percocet, morphine, hydrocodone, or oxycodone please make sure you do not drive when you are taking this medication as it can alter your ability to drive safely.  If you have any concerns once you are home that you are not improving or are in fact getting worse before you can make it to your follow-up appointment, please do not hesitate to call 911 and come back for further evaluation.  Darel Hong, MD  Results for orders placed or performed during the hospital encounter of 10/07/16  Urinalysis, Complete w Microscopic  Result Value Ref Range   Color, Urine YELLOW (A) YELLOW   APPearance CLEAR (A) CLEAR   Specific Gravity, Urine 1.016 1.005 - 1.030   pH 5.0 5.0 - 8.0   Glucose, UA NEGATIVE NEGATIVE mg/dL   Hgb urine dipstick NEGATIVE NEGATIVE   Bilirubin Urine NEGATIVE NEGATIVE   Ketones, ur NEGATIVE NEGATIVE mg/dL   Protein, ur NEGATIVE NEGATIVE mg/dL   Nitrite NEGATIVE NEGATIVE   Leukocytes, UA NEGATIVE NEGATIVE   RBC / HPF 0-5 0 - 5 RBC/hpf   WBC, UA 0-5 0 - 5 WBC/hpf   Bacteria, UA NONE SEEN NONE SEEN   Squamous Epithelial / LPF 0-5 (A) NONE SEEN   Mucus PRESENT   CBC  Result Value Ref Range   WBC 10.1 3.6 - 11.0 K/uL   RBC 4.93 3.80 - 5.20 MIL/uL   Hemoglobin 14.9 12.0 - 16.0 g/dL   HCT 43.8 35.0 - 47.0 %   MCV 88.8 80.0 - 100.0 fL   MCH 30.3 26.0 - 34.0 pg   MCHC 34.1 32.0 - 36.0 g/dL   RDW 14.6 (H) 11.5 - 14.5 %   Platelets 258 150 - 440 K/uL  Lipase, blood  Result Value Ref Range   Lipase 23 11 - 51 U/L   Ct Renal Stone Study  Result Date: 10/07/2016 CLINICAL DATA:  Left flank and lower quadrant pain beginning 2 hours ago with nausea and dry heaving. EXAM: CT ABDOMEN AND PELVIS WITHOUT CONTRAST TECHNIQUE: Multidetector CT imaging of the abdomen and pelvis was performed following the standard protocol without IV contrast. COMPARISON:  None. FINDINGS: Lower chest: Mild emphysematous disease within the lung bases. Hepatobiliary: Previous cholecystectomy. Liver and biliary tree are otherwise unremarkable. Pancreas: Within normal. Spleen: Within normal. Adrenals/Urinary Tract: Adrenal glands are normal. Kidneys are normal in size without nephrolithiasis. Subcentimeter cyst over the upper pole left kidney. No right-sided hydronephrosis. Mild dilatation of the left intrarenal pelvis and somewhat dilated tortuous left ureter. Mild dilatation of the right ureter. Bladder is within normal. Stomach/Bowel: The stomach and small bowel are within normal. Appendix is not well visualized. There is diverticulosis of the colon. Vascular/Lymphatic: There is mild calcified plaque over the abdominal aorta and iliac arteries. No  definite adenopathy. Reproductive: Previous hysterectomy. There is increased soft tissue density in the region of the vagina with mild impression on the bladder base near the trigone as cannot exclude a degree of bladder outlet obstruction in light of the dilated left ureter and intrarenal collecting system. Other: No free fluid or focal inflammatory change. No abdominal wall hernia. Musculoskeletal: Degenerative change of the spine and hips. IMPRESSION: Mild dilatation of the left intrarenal collecting system with dilated somewhat tortuous  left ureter down to the UVJ. No definite stones visualized. Minimal dilatation of the right ureter. There is increased soft tissue density over the vagina with impression on the bladder base near the trigone which may be causing a degree of obstruction. Cannot exclude a mass/neoplasm as recommend clinical correlation. Diverticulosis of the colon. Aortic Atherosclerosis (ICD10-I70.0) and Emphysema (ICD10-J43.9). Electronically Signed   By: Marin Olp M.D.   On: 10/07/2016 18:43

## 2016-10-07 NOTE — ED Provider Notes (Signed)
[facility] Emergency Department Provider Note  ____________________________________________   First MD Initiated Contact with Patient 10/07/16 1801     (approximate)  I have reviewed the triage vital signs and the nursing notes.   HISTORY  Chief Complaint Flank Pain and Abdominal Pain   HPI Brenda Rocha is a 71 y.o. female who self presents to the emergency department with 2 hours of sudden onset left flank pain radiating to her left groin. It's associated with nausea but no vomiting. She's been dry heaving. She denies dysuria or hematuria. She has no history of kidney stones. Her pain began suddenly and has been constant ever since. Nothing seems to make it better or worse. She denies fevers or chills. Past medical history includes a, COPD, and neurofibromatosis.   Past Medical History:  Diagnosis Date  . Cancer ([place])    breast  . COPD (chronic obstructive pulmonary disease) ([place])   . Neurofibromatosis [facility])     Patient Active Problem List   Diagnosis Date Noted  . Osteoporosis 09/08/2016  . History of breast cancer 09/10/2014  . COLD (chronic obstructive lung disease) ([place]) 09/10/2014  . Hyperlipidemia 09/10/2014  . Tobacco abuse 09/10/2014    Past Surgical History:  Procedure Laterality Date  . ABDOMINAL HYSTERECTOMY    . BREAST SURGERY     mastectomy  . CHOLECYSTECTOMY    . MASTECTOMY Bilateral     Prior to Admission medications   Medication Sig Start Date End Date Taking? Authorizing Provider  albuterol (PROVENTIL HFA;VENTOLIN HFA) 108 (90 Base) MCG/ACT inhaler Inhale 2 puffs into the lungs every 6 (six) hours as needed for wheezing or shortness of breath. 08/10/16  Yes Johnson, Megan P, DO  tiotropium (SPIRIVA HANDIHALER) 18 MCG inhalation capsule Place 1 capsule (18 mcg total) into inhaler and inhale daily. 08/10/16  Yes Johnson, Megan P, DO  ibuprofen (ADVIL,MOTRIN) 600 MG tablet Take 1 tablet (600 mg total) by mouth every 8  (eight) hours as needed. 10/07/16   Darel Hong, MD  ondansetron (ZOFRAN ODT) 4 MG disintegrating tablet Take 1 tablet (4 mg total) by mouth every 8 (eight) hours as needed for nausea or vomiting. 10/07/16   Darel Hong, MD  oxyCODONE-acetaminophen (ROXICET) 5-325 MG tablet Take 1 tablet by mouth every 6 (six) hours as needed for severe pain. 10/07/16   Darel Hong, MD    Allergies Patient has no known allergies.  Family History  Problem Relation Age of Onset  . Cancer Mother        breast  . Heart disease Mother   . Thyroid disease Mother   . Hypertension Mother   . Osteoporosis Mother   . Hypertension Sister   . Hypertension Brother   . Cancer Maternal Grandmother        breast , bone    Social History Social History  Substance Use Topics  . Smoking status: Current Some Day Smoker    Packs/day: 0.25    Types: Cigarettes  . Smokeless tobacco: Never Used  . Alcohol use 0.0 oz/week     Comment: on occasion    Review of Systems Constitutional: No fever/chills Eyes: No visual changes. ENT: No sore throat. Cardiovascular: Denies chest pain. Respiratory: Denies shortness of breath. Gastrointestinal: Positive abdominal pain.  Positive nausea, no vomiting.  No diarrhea.  No constipation. Genitourinary: Negative for dysuria. Musculoskeletal: Positive for back pain. Skin: Negative for rash. Neurological: Negative for headaches, focal weakness or numbness.   ____________________________________________   PHYSICAL EXAM:  VITAL SIGNS: ED Triage Vitals [10/07/16 1553]  Enc Vitals Group     BP      Pulse      Resp      Temp      Temp src      SpO2      Weight      Height      Head Circumference      Peak Flow      Pain Score 10     Pain Loc      Pain Edu?      Excl. in [place]?     Constitutional: Alert and oriented 4 junk laughing well appearing nontoxic no diaphoresis speaks in full clear sentences Eyes: PERRL EOMI. Head: Atraumatic. Nose: No  congestion/rhinnorhea. Mouth/Throat: No trismus Neck: No stridor.   Cardiovascular: Normal rate, regular rhythm. Grossly normal heart sounds.  Good peripheral circulation. Respiratory: Normal respiratory effort.  No retractions. Lungs CTAB and moving good air Gastrointestinal: Soft nondistended nontender no rebound or guarding no peritonitis no McBurney tenderness negative Rovsing's no costovertebral tenderness Musculoskeletal: No lower extremity edema   Neurologic:  Normal speech and language. No gross focal neurologic deficits are appreciated. Skin:  Innumerable neurofibromatosis lesions all over her body Psychiatric: Mood and affect are normal. Speech and behavior are normal.    ____________________________________________   DIFFERENTIAL includes but not limited to  Renal colic, urinary tract infection, pyelonephritis, obstruction, AAA ____________________________________________   LABS (all labs ordered are listed, but only abnormal results are displayed)  Labs Reviewed  URINALYSIS, COMPLETE (UACMP) WITH MICROSCOPIC - Abnormal; Notable for the following:       Result Value   Color, Urine YELLOW (*)    APPearance CLEAR (*)    Squamous Epithelial / LPF 0-5 (*)    All other components within normal limits  CBC - Abnormal; Notable for the following:    RDW 14.6 (*)    All other components within normal limits  LIPASE, BLOOD    Blood work and urinalysis reviewed and interpreted by me, no signs of urinary tract infection and no hematuria __________________________________________  EKG   ____________________________________________  RADIOLOGY  CT scan renal stone protocol reviewed by me shows no stone but does show left-sided hydroureter ____________________________________________   PROCEDURES  Procedure(s) performed: no  Procedures  Critical Care performed: no  Observation: no ____________________________________________   INITIAL IMPRESSION / ASSESSMENT  AND PLAN / ED COURSE  Pertinent labs & imaging results that were available during my care of the patient were reviewed by me and considered in my medical decision making (see chart for details).  On arrival the patient is hemodynamically stable reports severe pain although she declines all pain medication. She has sudden onset severe left sided pain radiating towards her groin which is concerning for kidney stones. I appreciate that she has no blood in her urine, however the skin definitely occur with renal stones. CT scan is pending.  The patient's CT scan shows left greater than right hydroureter although no stone visualized. The patient still could have a small stone that was missed in between cuts on the CT scan. Regardless at this point her pain is adequately controlled and I will refer her back to primary care for further workup and evaluation. Strict return precautions given the patient verbalizes understanding and agreement with plan.      ____________________________________________   FINAL CLINICAL IMPRESSION(S) / ED DIAGNOSES  Final diagnoses:  Left flank pain  Kidney stone  NEW MEDICATIONS STARTED DURING THIS VISIT:  Discharge Medication List as of 10/07/2016  7:07 PM    START taking these medications   Details  ibuprofen (ADVIL,MOTRIN) 600 MG tablet Take 1 tablet (600 mg total) by mouth every 8 (eight) hours as needed., Starting Sat 10/07/2016, Print    ondansetron (ZOFRAN ODT) 4 MG disintegrating tablet Take 1 tablet (4 mg total) by mouth every 8 (eight) hours as needed for nausea or vomiting., Starting Sat 10/07/2016, Print    oxyCODONE-acetaminophen (ROXICET) 5-325 MG tablet Take 1 tablet by mouth every 6 (six) hours as needed for severe pain., Starting Sat 10/07/2016, Print         Note:  This document was prepared using Dragon voice recognition software and may include unintentional dictation errors.     Darel Hong, MD 10/08/16 1401

## 2016-10-07 NOTE — ED Triage Notes (Signed)
Pt arrived via POV from home with friend, pt c/o left flank pain and left lower quad abd pain that started about 2 hours ago. Pt states she has been dry heaving but denies any vomiting, Pt denies any dysuria or hx of kidney stone.

## 2016-10-07 NOTE — ED Notes (Signed)
Report from felicia, rn.  

## 2016-10-09 ENCOUNTER — Ambulatory Visit (INDEPENDENT_AMBULATORY_CARE_PROVIDER_SITE_OTHER): Payer: Medicare HMO | Admitting: Family Medicine

## 2016-10-09 ENCOUNTER — Encounter: Payer: Self-pay | Admitting: Family Medicine

## 2016-10-09 VITALS — BP 159/79 | HR 66 | Temp 97.7°F | Wt 96.2 lb

## 2016-10-09 DIAGNOSIS — N134 Hydroureter: Secondary | ICD-10-CM | POA: Diagnosis not present

## 2016-10-09 DIAGNOSIS — N200 Calculus of kidney: Secondary | ICD-10-CM

## 2016-10-09 DIAGNOSIS — R339 Retention of urine, unspecified: Secondary | ICD-10-CM

## 2016-10-09 LAB — UA/M W/RFLX CULTURE, ROUTINE
Bilirubin, UA: NEGATIVE
GLUCOSE, UA: NEGATIVE
LEUKOCYTES UA: NEGATIVE
Nitrite, UA: NEGATIVE
PROTEIN UA: NEGATIVE
Specific Gravity, UA: 1.015 (ref 1.005–1.030)
UUROB: 0.2 mg/dL (ref 0.2–1.0)
pH, UA: 6 (ref 5.0–7.5)

## 2016-10-09 LAB — MICROSCOPIC EXAMINATION: Bacteria, UA: NONE SEEN

## 2016-10-09 NOTE — Progress Notes (Signed)
BP (!) 159/79 (BP Location: Left Arm, Patient Position: Sitting, Cuff Size: Small)   Pulse 66   Temp 97.7 F (36.5 C)   Wt 96 lb 4 oz (43.7 kg)   SpO2 96%   BMI 17.89 kg/m    Subjective:    Patient ID: Brenda Rocha, female    DOB: April 24, 1945, 71 y.o.   MRN: [id number]  HPI: Brenda Rocha is a 71 y.o. female  Chief Complaint  Patient presents with  . ER Follow-up   ER FOLLOW UP Time since discharge: 2 days Hospital/facility: [facility] Diagnosis: L flank pain, concern for kidney stone, R hydroureter Procedures/tests: CT abdomen, Labs Consultants: None New medications: ibuprofen, percocet, zofran Discharge instructions:  Follow up here Status: better- unsure if she passed anything  She notes that sometimes she feels like she is not emptying her bladder enough. Has been having some dribbling. She notes that the pain was severe. Went from her R flank into her groin. She went to the ER due to the pain. Pain pill made her feel loopy, hasn't been taking her pain medicine regularly. Has been taking advil at home. She notes that she cut the pain medicine in 1/2.    Relevant past medical, surgical, family and social history reviewed and updated as indicated. Interim medical history since our last visit reviewed. Allergies and medications reviewed and updated.  Review of Systems  Constitutional: Negative.   Respiratory: Negative.   Cardiovascular: Negative.   Gastrointestinal: Negative.   Genitourinary: Positive for difficulty urinating, dysuria, flank pain and frequency. Negative for decreased urine volume, dyspareunia, enuresis, genital sores, hematuria, menstrual problem, pelvic pain, urgency, vaginal bleeding, vaginal discharge and vaginal pain.  Psychiatric/Behavioral: Negative.     Per HPI unless specifically indicated above     Objective:    BP (!) 159/79 (BP Location: Left Arm, Patient Position: Sitting, Cuff Size: Small)   Pulse 66   Temp 97.7 F (36.5 C)   Wt  96 lb 4 oz (43.7 kg)   SpO2 96%   BMI 17.89 kg/m   Wt Readings from Last 3 Encounters:  10/09/16 96 lb 4 oz (43.7 kg)  08/10/16 98 lb 3 oz (44.5 kg)  01/21/16 94 lb (42.6 kg)    Physical Exam  Constitutional: She is oriented to person, place, and time. She appears well-developed and well-nourished. No distress.  HENT:  Head: Normocephalic and atraumatic.  Right Ear: Hearing normal.  Left Ear: Hearing normal.  Nose: Nose normal.  Eyes: Conjunctivae and lids are normal. Right eye exhibits no discharge. Left eye exhibits no discharge. No scleral icterus.  Cardiovascular: Normal rate, regular rhythm, normal heart sounds and intact distal pulses.  Exam reveals no gallop and no friction rub.   No murmur heard. Pulmonary/Chest: Effort normal and breath sounds normal. No respiratory distress. She has no wheezes. She has no rales. She exhibits no tenderness.  Musculoskeletal: Normal range of motion.  Neurological: She is alert and oriented to person, place, and time.  Skin: Skin is warm, dry and intact. No rash noted. She is not diaphoretic. No erythema. No pallor.  Psychiatric: She has a normal mood and affect. Her speech is normal and behavior is normal. Judgment and thought content normal. Cognition and memory are normal.    Results for orders placed or performed during the hospital encounter of 10/07/16  Urinalysis, Complete w Microscopic  Result Value Ref Range   Color, Urine YELLOW (A) YELLOW   APPearance CLEAR (A) CLEAR  Specific Gravity, Urine 1.016 1.005 - 1.030   pH 5.0 5.0 - 8.0   Glucose, UA NEGATIVE NEGATIVE mg/dL   Hgb urine dipstick NEGATIVE NEGATIVE   Bilirubin Urine NEGATIVE NEGATIVE   Ketones, ur NEGATIVE NEGATIVE mg/dL   Protein, ur NEGATIVE NEGATIVE mg/dL   Nitrite NEGATIVE NEGATIVE   Leukocytes, UA NEGATIVE NEGATIVE   RBC / HPF 0-5 0 - 5 RBC/hpf   WBC, UA 0-5 0 - 5 WBC/hpf   Bacteria, UA NONE SEEN NONE SEEN   Squamous Epithelial / LPF 0-5 (A) NONE SEEN   Mucus  PRESENT   CBC  Result Value Ref Range   WBC 10.1 3.6 - 11.0 K/uL   RBC 4.93 3.80 - 5.20 MIL/uL   Hemoglobin 14.9 12.0 - 16.0 g/dL   HCT 43.8 35.0 - 47.0 %   MCV 88.8 80.0 - 100.0 fL   MCH 30.3 26.0 - 34.0 pg   MCHC 34.1 32.0 - 36.0 g/dL   RDW 14.6 (H) 11.5 - 14.5 %   Platelets 258 150 - 440 K/uL  Lipase, blood  Result Value Ref Range   Lipase 23 11 - 51 U/L      Assessment & Plan:   Problem List Items Addressed This Visit    None    Visit Diagnoses    Kidney stone    -  Primary   Feeling much better. Unclear if kidney stone or not. UA clear again. Will refer to urology. Call with any concerns.    Relevant Orders   UA/M w/rflx Culture, Routine   Ambulatory referral to Urology   Hydroureter on right       Feeling much better. Unclear if kidney stone or not. UA clear again. Will refer to urology. Call with any concerns.    Relevant Orders   Ambulatory referral to Urology   Incomplete bladder emptying       Feeling much better. Unclear if kidney stone or not. UA clear again. Will refer to urology. Call with any concerns.    Relevant Orders   Ambulatory referral to Urology       Follow up plan: Return if symptoms worsen or fail to improve.

## 2016-11-06 ENCOUNTER — Ambulatory Visit (INDEPENDENT_AMBULATORY_CARE_PROVIDER_SITE_OTHER): Payer: Medicare HMO | Admitting: Urology

## 2016-11-06 ENCOUNTER — Encounter: Payer: Self-pay | Admitting: Urology

## 2016-11-06 VITALS — BP 183/61 | HR 66 | Ht 61.5 in | Wt 96.8 lb

## 2016-11-06 DIAGNOSIS — N1330 Unspecified hydronephrosis: Secondary | ICD-10-CM | POA: Diagnosis not present

## 2016-11-06 DIAGNOSIS — N1339 Other hydronephrosis: Secondary | ICD-10-CM

## 2016-11-06 DIAGNOSIS — N200 Calculus of kidney: Secondary | ICD-10-CM | POA: Diagnosis not present

## 2016-11-06 LAB — URINALYSIS, COMPLETE
BILIRUBIN UA: NEGATIVE
Glucose, UA: NEGATIVE
Ketones, UA: NEGATIVE
Leukocytes, UA: NEGATIVE
NITRITE UA: NEGATIVE
PH UA: 5.5 (ref 5.0–7.5)
Protein, UA: NEGATIVE
RBC, UA: NEGATIVE
SPEC GRAV UA: 1.01 (ref 1.005–1.030)
Urobilinogen, Ur: 0.2 mg/dL (ref 0.2–1.0)

## 2016-11-06 NOTE — Progress Notes (Signed)
11/06/2016 10:31 AM   Brenda Rocha 1945-01-23 [id number]  Referring provider: Valerie Roys, DO [place], [place] [address]  Chief Complaint  Patient presents with  . Nephrolithiasis    HPI: 71 yo referred for left LQ pain and left hydronephrosis. Pain developed suddenly and was severe in LLQ. She had a hot flash feeling. No stone passage. No dysuria or gross hematuria. She underwent CT Oct 07, 2016 which revealed moderate LEFT hydroureteronephrosis down to the bladder. No stone, mass or ureterocele. I reviewed the images. Bladder not distended. Radiologist thought there was increased soft tissue density in the anterior vaginal area but I didn't appreciate that finding. Of note a 2013 CT revealed bladder wall thickening and dilation of lower third of left ureter similar to current CT but less upper tract dilation, but  RIGHT HUN down to bladder without stone or mass. I reviewed the images as well.   She voids with a good flow and has no frequency or urgency. She has Nocturia x 2. No NG risk. She recalls seeing "Dr. Redmond Baseman" for a bladder "growth". She underwent taking a "growth off the bladder but there was a concern for a "fistula". Then 8 weeks later she went back for hysterectomy. This was when she was in her 89's. She's noticed no mass or prolapse symptoms.   UA negative x 2 and clear today. Bun and Cr 16, 0.77 Jul 2018.   She is a smoker.  Modifying factors: There are no other modifying factors  Associated signs and symptoms: There are no other associated signs and symptoms Aggravating and relieving factors: There are no other aggravating or relieving factors Severity: Moderate Duration: Persistent PMH: Past Medical History:  Diagnosis Date  . Cancer ([place])    breast  . COPD (chronic obstructive pulmonary disease) ([place])   . Neurofibromatosis [facility])     Surgical History: Past Surgical History:  Procedure Laterality Date  . ABDOMINAL HYSTERECTOMY    . BREAST  SURGERY     mastectomy  . CHOLECYSTECTOMY    . MASTECTOMY Bilateral     Home Medications:  Allergies as of 11/06/2016   No Known Allergies     Medication List       Accurate as of 11/06/16 10:31 AM. Always use your most recent med list.          albuterol 108 (90 Base) MCG/ACT inhaler Commonly known as:  PROVENTIL HFA;VENTOLIN HFA Inhale 2 puffs into the lungs every 6 (six) hours as needed for wheezing or shortness of breath.   ibuprofen 600 MG tablet Commonly known as:  ADVIL,MOTRIN Take 1 tablet (600 mg total) by mouth every 8 (eight) hours as needed.   ondansetron 4 MG disintegrating tablet Commonly known as:  ZOFRAN ODT Take 1 tablet (4 mg total) by mouth every 8 (eight) hours as needed for nausea or vomiting.   oxyCODONE-acetaminophen 5-325 MG tablet Commonly known as:  ROXICET Take 1 tablet by mouth every 6 (six) hours as needed for severe pain.   tiotropium 18 MCG inhalation capsule Commonly known as:  SPIRIVA HANDIHALER Place 1 capsule (18 mcg total) into inhaler and inhale daily.       Allergies: No Known Allergies  Family History: Family History  Problem Relation Age of Onset  . Cancer Mother        breast  . Heart disease Mother   . Thyroid disease Mother   . Hypertension Mother   . Osteoporosis Mother   .  Hypertension Sister   . Hypertension Brother   . Cancer Maternal Grandmother        breast , bone  . Bladder Cancer Neg Hx   . Kidney cancer Neg Hx     Social History:  reports that she has been smoking Cigarettes.  She has been smoking about 0.25 packs per day. She has never used smokeless tobacco. She reports that she drinks alcohol. She reports that she does not use drugs.  ROS: UROLOGY Frequent Urination?: No Hard to postpone urination?: No Burning/pain with urination?: No Get up at night to urinate?: Yes Leakage of urine?: No Urine stream starts and stops?: No Trouble starting stream?: No Do you have to strain to urinate?:  No Blood in urine?: No Urinary tract infection?: No Sexually transmitted disease?: No Injury to kidneys or bladder?: No Painful intercourse?: No Weak stream?: No Currently pregnant?: No Vaginal bleeding?: No Last menstrual period?: n  Gastrointestinal Nausea?: Yes Vomiting?: Yes Indigestion/heartburn?: No Diarrhea?: No Constipation?: No  Constitutional Fever: No Night sweats?: No Weight loss?: Yes Fatigue?: Yes  Skin Skin rash/lesions?: No Itching?: No  Eyes Blurred vision?: No Double vision?: No  Ears/Nose/Throat Sore throat?: No Sinus problems?: Yes  Hematologic/Lymphatic Swollen glands?: No Easy bruising?: No  Cardiovascular Leg swelling?: No Chest pain?: No  Respiratory Cough?: No Shortness of breath?: No  Endocrine Excessive thirst?: No  Musculoskeletal Back pain?: Yes Joint pain?: No  Neurological Headaches?: Yes Dizziness?: No  Psychologic Depression?: No Anxiety?: No  Physical Exam: BP (!) 183/61 (BP Location: Right Arm, Patient Position: Sitting, Cuff Size: Normal)   Pulse 66   Ht 5' 1.5" (1.562 m)   Wt 43.9 kg (96 lb 12.8 oz)   BMI 17.99 kg/m   Constitutional:  Alert and oriented, No acute distress. HEENT:  AT, moist mucus membranes.  Trachea midline, no masses. Cardiovascular: No clubbing, cyanosis, or edema. Respiratory: Normal respiratory effort, no increased work of breathing. GI: Abdomen is soft, nontender, nondistended, no abdominal masses GU: No CVA tenderness.  Skin: No rashes, bruises or suspicious lesions. Lymph: No cervical or inguinal adenopathy. Neurologic: Grossly intact, no focal deficits, moving all 4 extremities. Psychiatric: Normal mood and affect.  Laboratory Data: Lab Results  Component Value Date   WBC 10.1 10/07/2016   HGB 14.9 10/07/2016   HCT 43.8 10/07/2016   MCV 88.8 10/07/2016   PLT 258 10/07/2016    Lab Results  Component Value Date   CREATININE 0.77 08/10/2016    No results found  for: PSA1  No results found for: TESTOSTERONE  No results found for: HGBA1C  Urinalysis Lab Results  Component Value Date   SPECGRAV 1.015 10/09/2016   PHUR 6.0 10/09/2016   COLORU Yellow 10/09/2016   APPEARANCEUR Hazy (A) 10/09/2016   LEUKOCYTESUR Negative 10/09/2016   PROTEINUR Negative 10/09/2016   GLUCOSEU Negative 10/09/2016   KETONESU Trace (A) 10/09/2016   RBCU Trace (A) 10/09/2016   BILIRUBINUR Negative 10/09/2016   UUROB 0.2 10/09/2016   NITRITE Negative 10/09/2016    Lab Results  Component Value Date   LABMICR See below: 10/09/2016   WBCUA 0-5 10/09/2016   RBCUA 0-2 10/09/2016   LABEPIT 0-10 10/09/2016   BACTERIA None seen 10/09/2016    Pertinent Imaging: CT 2018 and 2013  No results found for this or any previous visit. No results found for this or any previous visit. No results found for this or any previous visit. No results found for this or any previous visit. No results found for  this or any previous visit. No results found for this or any previous visit. No results found for this or any previous visit. Results for orders placed during the hospital encounter of 10/07/16  CT Renal Stone Study   Narrative CLINICAL DATA:  Left flank and lower quadrant pain beginning 2 hours ago with nausea and dry heaving.  EXAM: CT ABDOMEN AND PELVIS WITHOUT CONTRAST  TECHNIQUE: Multidetector CT imaging of the abdomen and pelvis was performed following the standard protocol without IV contrast.  COMPARISON:  None.  FINDINGS: Lower chest: Mild emphysematous disease within the lung bases.  Hepatobiliary: Previous cholecystectomy. Liver and biliary tree are otherwise unremarkable.  Pancreas: Within normal.  Spleen: Within normal.  Adrenals/Urinary Tract: Adrenal glands are normal. Kidneys are normal in size without nephrolithiasis. Subcentimeter cyst over the upper pole left kidney. No right-sided hydronephrosis. Mild dilatation of the left intrarenal  pelvis and somewhat dilated tortuous left ureter. Mild dilatation of the right ureter. Bladder is within normal.  Stomach/Bowel: The stomach and small bowel are within normal. Appendix is not well visualized. There is diverticulosis of the colon.  Vascular/Lymphatic: There is mild calcified plaque over the abdominal aorta and iliac arteries. No definite adenopathy.  Reproductive: Previous hysterectomy. There is increased soft tissue density in the region of the vagina with mild impression on the bladder base near the trigone as cannot exclude a degree of bladder outlet obstruction in Rocha of the dilated left ureter and intrarenal collecting system.  Other: No free fluid or focal inflammatory change. No abdominal wall hernia.  Musculoskeletal: Degenerative change of the spine and hips.  IMPRESSION: Mild dilatation of the left intrarenal collecting system with dilated somewhat tortuous left ureter down to the UVJ. No definite stones visualized. Minimal dilatation of the right ureter. There is increased soft tissue density over the vagina with impression on the bladder base near the trigone which may be causing a degree of obstruction. Cannot exclude a mass/neoplasm as recommend clinical correlation.  Diverticulosis of the colon.  Aortic Atherosclerosis (ICD10-I70.0) and Emphysema (ICD10-J43.9).   Electronically Signed   By: Marin Olp M.D.   On: 10/07/2016 18:43     Assessment & Plan:     1. Hydronephrosis- right HUN and left ureter dilation in 2013 CT, now with left HUN down to bladder. Question of soft tissue thickening anterior vaginal wall. Will plan CT with IV contrast and delays and cystoscopy for evaluation and a PVR. She is a smoker and has h/o GU surgery and hx in her 2's. Discussed link between smoking and GU cancers and I encouraged her to stop.   - Urinalysis, Complete   No Follow-up on file.  Festus Aloe, [facility] [address], [place], [place] [address] [phone]

## 2016-11-21 ENCOUNTER — Ambulatory Visit
Admission: RE | Admit: 2016-11-21 | Discharge: 2016-11-21 | Disposition: A | Discharge status: Home / Self Care | Payer: Medicare HMO | Source: Ambulatory Visit | Attending: Urology | Admitting: Urology

## 2016-11-21 DIAGNOSIS — R6889 Other general symptoms and signs: Secondary | ICD-10-CM | POA: Diagnosis not present

## 2016-11-21 DIAGNOSIS — N1339 Other hydronephrosis: Secondary | ICD-10-CM | POA: Insufficient documentation

## 2016-11-21 DIAGNOSIS — N134 Hydroureter: Secondary | ICD-10-CM | POA: Insufficient documentation

## 2016-11-21 DIAGNOSIS — I700 Atherosclerosis of aorta: Secondary | ICD-10-CM | POA: Insufficient documentation

## 2016-11-21 DIAGNOSIS — N8189 Other female genital prolapse: Secondary | ICD-10-CM | POA: Insufficient documentation

## 2016-11-21 DIAGNOSIS — R109 Unspecified abdominal pain: Secondary | ICD-10-CM | POA: Diagnosis not present

## 2016-11-21 HISTORY — DX: Malignant neoplasm of unspecified site of right female breast: C50.911

## 2016-11-21 LAB — POCT I-STAT CREATININE: CREATININE: 0.7 mg/dL (ref 0.44–1.00)

## 2016-11-21 MED ORDER — IOPAMIDOL (ISOVUE-300) INJECTION 61%
85.0000 mL | Freq: Once | INTRAVENOUS | Status: AC | PRN
  Administered 2016-11-21: 85 mL via INTRAVENOUS

## 2016-11-24 ENCOUNTER — Ambulatory Visit: Payer: Medicare HMO | Admitting: Urology

## 2016-11-24 ENCOUNTER — Encounter: Payer: Self-pay | Admitting: Urology

## 2016-11-24 VITALS — BP 162/79 | HR 70 | Ht 61.5 in | Wt 96.4 lb

## 2016-11-24 DIAGNOSIS — N1330 Unspecified hydronephrosis: Secondary | ICD-10-CM

## 2016-11-24 DIAGNOSIS — R339 Retention of urine, unspecified: Secondary | ICD-10-CM

## 2016-11-24 LAB — URINALYSIS, COMPLETE
Bilirubin, UA: NEGATIVE
GLUCOSE, UA: NEGATIVE
KETONES UA: NEGATIVE
LEUKOCYTES UA: NEGATIVE
NITRITE UA: NEGATIVE
PROTEIN UA: NEGATIVE
RBC, UA: NEGATIVE
SPEC GRAV UA: 1.02 (ref 1.005–1.030)
Urobilinogen, Ur: 0.2 mg/dL (ref 0.2–1.0)
pH, UA: 5.5 (ref 5.0–7.5)

## 2016-11-24 LAB — BLADDER SCAN AMB NON-IMAGING: Scan Result: 263

## 2016-11-24 LAB — MICROSCOPIC EXAMINATION
RBC, UA: NONE SEEN /hpf (ref 0–?)
WBC UA: NONE SEEN /HPF (ref 0–?)

## 2016-11-24 MED ORDER — CIPROFLOXACIN HCL 500 MG PO TABS
500.0000 mg | ORAL_TABLET | Freq: Once | ORAL | Status: AC
  Administered 2016-11-24: 500 mg via ORAL

## 2016-11-24 MED ORDER — LIDOCAINE HCL 2 % EX GEL
1.0000 "application " | Freq: Once | CUTANEOUS | Status: AC
  Administered 2016-11-24: 1 via URETHRAL

## 2016-11-24 NOTE — Progress Notes (Signed)
11/24/16  CC:  Chief Complaint  Patient presents with  . Cysto    HPI: The patient is a 71 year old female who presents for follow-up of left lower quadrant pain and left hydronephrosis presents today for cystoscopy and to discuss CT urogram results.  CT urogram showed significant bilateral hydroureteronephrosis down to the level of the bladder.  The bladder does appear distended.  She also had an area on her anterior vagina wall that was hyperemic likely from chronic pelvic for laxity.  Pelvic exam was recommended which was unremarkable today for anterior vaginal wall mass.  Background history: 71 yo referred for left LQ pain and left hydronephrosis. Pain developed suddenly and was severe in LLQ. She had a hot flash feeling. No stone passage. No dysuria or gross hematuria. She underwent CT Oct 07, 2016 which revealed moderate LEFT hydroureteronephrosis down to the bladder. No stone, mass or ureterocele. I reviewed the images. Bladder not distended. Radiologist thought there was increased soft tissue density in the anterior vaginal area but I didn't appreciate that finding. Of note a 2013 CT revealed bladder wall thickening and dilation of lower third of left ureter similar to current CT but less upper tract dilation, but  RIGHT HUN down to bladder without stone or mass. I reviewed the images as well.   She voids with a good flow and has no frequency or urgency. She has Nocturia x 2. No NG risk. She recalls seeing "Dr. Redmond Baseman" for a bladder "growth". She underwent taking a "growth off the bladder but there was a concern for a "fistula". Then 8 weeks later she went back for hysterectomy. This was when she was in her 71's. She's noticed no mass or prolapse symptoms.   UA negative x 2 and clear today. Bun and Cr 16, 0.77 Jul 2018.    There were no vitals taken for this visit. NED. A&Ox3.   No respiratory distress   Abd soft, NT, ND Normal external genitalia with patent urethral meatus. No  mass on anterior vaginal wall.  Cystoscopy Procedure Note  Patient identification was confirmed, informed consent was obtained, and patient was prepped using Betadine solution.  Lidocaine jelly was administered per urethral meatus.    Preoperative abx where received prior to procedure.    Procedure: - Flexible cystoscope introduced, without any difficulty.   - Thorough search of the bladder revealed:    normal urethral meatus    normal urothelium    no stones    no ulcers     no tumors    no urethral polyps    Bladder trabeculated with saccules.  Consistent with poor bladder emptying.  - Ureteral orifices were normal in position and appearance.  Post-Procedure: - Patient tolerated the procedure well  Assessment/ Plan:  1.  Bilateral hydroureteronephrosis 2.  Incomplete bladder emptying Her PVR today was only 263 however I feel this is inaccurate as her bladder was very distended on initial cystoscope placement, had signs of a poorly emptying bladder, and very distended on CT.  I do think that her bilateral hydroureteronephrosis is from chronic urinary reflux from incomplete bladder emptying.  Creatinine right now is actually quite normal.  I did share with her my concern though that this could lead to further bladder decompensation, recurrent urinary tract infections, and permanent renal damage.  I did recommend that we put a catheter in for the short-term and have her follow-up in 2-3 weeks for a repeat renal ultrasound prior to ensure resolution of the hydronephrosis.  If it does resolve at that time, we could consider teaching her CIC as this is likely neurogenic bladder in origin.  She will follow-up for the above  Nickie Retort, MD

## 2016-11-24 NOTE — Progress Notes (Signed)
Cath Change/ Replacement  Patient is present today for a catheter due to urinary retention.    Patient was cleaned and prepped in a sterile fashion with betadine and 2% lidocaine jelly was instilled into the urethra. A 16 FR foley cath was replaced into the bladder no complications were noted Urine return was noted 255ml and urine was yellow in color. The balloon was filled with 45ml of sterile water. A leg bag was attached for drainage.  A night bag was also given to the patient and patient was given instruction on how to change from one bag to another. Patient was given proper instruction on catheter care.    Preformed by: Elberta Leatherwood, CMA

## 2016-11-28 ENCOUNTER — Telehealth: Payer: Self-pay

## 2016-11-28 DIAGNOSIS — N1330 Unspecified hydronephrosis: Secondary | ICD-10-CM

## 2016-11-28 NOTE — Telephone Encounter (Signed)
Festus Aloe, MD  Lestine Box, LPN        Notify patient and her CT scan showed nothing worrisome but both ureters are slightly dilated. She does need to follow-up for exam and a cystoscopy, but I would like for her to get a Lasix renal scan prior to her return.    LMOM-  Lasix renal scan ordered.

## 2016-11-28 NOTE — Telephone Encounter (Signed)
Spoke with pt in reference to CT results and needing a renal lasix prior to appt. Pt voiced understanding.

## 2016-11-30 ENCOUNTER — Ambulatory Visit: Payer: Medicare HMO

## 2016-12-04 ENCOUNTER — Ambulatory Visit
Admission: RE | Admit: 2016-12-04 | Discharge: 2016-12-04 | Disposition: A | Discharge status: Home / Self Care | Payer: Medicare HMO | Source: Ambulatory Visit | Attending: Urology | Admitting: Urology

## 2016-12-04 DIAGNOSIS — N281 Cyst of kidney, acquired: Secondary | ICD-10-CM | POA: Diagnosis not present

## 2016-12-04 DIAGNOSIS — Z960 Presence of urogenital implants: Secondary | ICD-10-CM | POA: Diagnosis not present

## 2016-12-04 DIAGNOSIS — R339 Retention of urine, unspecified: Secondary | ICD-10-CM | POA: Diagnosis present

## 2016-12-04 DIAGNOSIS — N1330 Unspecified hydronephrosis: Secondary | ICD-10-CM | POA: Diagnosis present

## 2016-12-15 ENCOUNTER — Encounter: Payer: Self-pay | Admitting: Urology

## 2016-12-15 ENCOUNTER — Ambulatory Visit: Payer: Medicare HMO | Admitting: Urology

## 2016-12-15 VITALS — BP 143/77 | HR 95 | Ht 62.0 in | Wt 91.1 lb

## 2016-12-15 DIAGNOSIS — R339 Retention of urine, unspecified: Secondary | ICD-10-CM

## 2016-12-15 NOTE — Progress Notes (Signed)
12/15/2016 2:24 PM   Brenda Rocha [birth year] [id number]  Referring provider: Valerie Roys, DO [place], [place] [address]  Chief Complaint  Patient presents with  . Follow-up    RUS results    HPI: The patient is a 71 year old female with past medical history of bilateral hydroureteronephrosis and markedly distended bladder presents today for follow-up.  She was found to have bilateral hydronephrosis with a distended bladder and a CT scan that was performed after having left lower quadrant pain she also underwent cystoscopy which was remarkable only for a very distended bladder with also noted bladder trabeculation and saccules.  At her last visit, Foley catheter was placed in attempt to  decompressor upper and lower urinary tract.  She  underwent a renal ultrasound which did show complete resolution of her hydronephrosis as well as the Foley catheter in place.  She is tolerating the Foley well.  She has previously noted seeing "Dr. Redmond Baseman" for a bladder "growth". She underwent taking a "growth off the bladder but there was a concern for a "fistula". Then 8 weeks later she went back for hysterectomy. This was when she was in her 7's.She's noticed no mass or prolapse symptoms. She also noted on a recent CT to have hyperemia of the anterior vaginal wall.  There was no mass palpable on exam.  It is likely from pelvic floor laxity per the radiologist     PMH: Past Medical History:  Diagnosis Date  . Breast cancer, right [facility]) 2003   Bilateral mastectomies.   Marland Kitchen COPD (chronic obstructive pulmonary disease) ([place])   . Neurofibromatosis [facility])     Surgical History: Past Surgical History:  Procedure Laterality Date  . ABDOMINAL HYSTERECTOMY    . BREAST SURGERY     mastectomy  . CHOLECYSTECTOMY    . MASTECTOMY Bilateral     Home Medications:  Allergies as of 12/15/2016   No Known Allergies     Medication List        Accurate as of 12/15/16  2:24 PM. Always use  your most recent med list.          albuterol 108 (90 Base) MCG/ACT inhaler Commonly known as:  PROVENTIL HFA;VENTOLIN HFA Inhale 2 puffs into the lungs every 6 (six) hours as needed for wheezing or shortness of breath.   tiotropium 18 MCG inhalation capsule Commonly known as:  SPIRIVA HANDIHALER Place 1 capsule (18 mcg total) into inhaler and inhale daily.       Allergies: No Known Allergies  Family History: Family History  Problem Relation Age of Onset  . Cancer Mother        breast  . Heart disease Mother   . Thyroid disease Mother   . Hypertension Mother   . Osteoporosis Mother   . Hypertension Sister   . Hypertension Brother   . Cancer Maternal Grandmother        breast , bone  . Bladder Cancer Neg Hx   . Kidney cancer Neg Hx     Social History:  reports that she has been smoking cigarettes.  She has been smoking about 0.25 packs per day. she has never used smokeless tobacco. She reports that she drinks alcohol. She reports that she does not use drugs.  ROS: UROLOGY Frequent Urination?: No Hard to postpone urination?: No Burning/pain with urination?: No Get up at night to urinate?: No Leakage of urine?: No Urine stream starts and stops?: No Trouble starting stream?: No Do you  have to strain to urinate?: No Blood in urine?: No Urinary tract infection?: No Sexually transmitted disease?: No Injury to kidneys or bladder?: No Painful intercourse?: No Weak stream?: No Currently pregnant?: No Vaginal bleeding?: No  Gastrointestinal Nausea?: No Vomiting?: No Indigestion/heartburn?: No Diarrhea?: No Constipation?: No  Constitutional Fever: No Night sweats?: No Weight loss?: No Fatigue?: No  Skin Skin rash/lesions?: No Itching?: No  Eyes Blurred vision?: No Double vision?: No  Ears/Nose/Throat Sore throat?: No Sinus problems?: No  Hematologic/Lymphatic Swollen glands?: No Easy bruising?: No  Cardiovascular Leg swelling?: No Chest  pain?: No  Respiratory Cough?: No Shortness of breath?: No  Endocrine Excessive thirst?: No  Musculoskeletal Back pain?: No Joint pain?: No  Neurological Headaches?: No Dizziness?: No  Psychologic Depression?: No Anxiety?: No  Physical Exam: BP (!) 143/77   Pulse 95   Ht 5\' 2"  (1.575 m)   Wt 91 lb 1.6 oz (41.3 kg)   BMI 16.66 kg/m   Constitutional:  Alert and oriented, No acute distress. HEENT: [place] AT, moist mucus membranes.  Trachea midline, no masses. Cardiovascular: No clubbing, cyanosis, or edema. Respiratory: Normal respiratory effort, no increased work of breathing. GI: Abdomen is soft, nontender, nondistended, no abdominal masses GU: No CVA tenderness.  Skin: No rashes, bruises or suspicious lesions. Lymph: No cervical or inguinal adenopathy. Neurologic: Grossly intact, no focal deficits, moving all 4 extremities. Psychiatric: Normal mood and affect.  Laboratory Data: Lab Results  Component Value Date   WBC 10.1 10/07/2016   HGB 14.9 10/07/2016   HCT 43.8 10/07/2016   MCV 88.8 10/07/2016   PLT 258 10/07/2016    Lab Results  Component Value Date   CREATININE 0.70 11/21/2016    No results found for: PSA  No results found for: TESTOSTERONE  No results found for: HGBA1C  Urinalysis    Component Value Date/Time   COLORURINE YELLOW (A) 10/07/2016 1552   APPEARANCEUR Cloudy (A) 11/24/2016 1015   LABSPEC 1.016 10/07/2016 1552   LABSPEC 1.016 06/21/2011 0451   PHURINE 5.0 10/07/2016 1552   GLUCOSEU Negative 11/24/2016 1015   GLUCOSEU Negative 06/21/2011 0451   HGBUR NEGATIVE 10/07/2016 1552   BILIRUBINUR Negative 11/24/2016 1015   BILIRUBINUR Negative 06/21/2011 0451   KETONESUR NEGATIVE 10/07/2016 1552   PROTEINUR Negative 11/24/2016 1015   PROTEINUR NEGATIVE 10/07/2016 1552   NITRITE Negative 11/24/2016 1015   NITRITE NEGATIVE 10/07/2016 1552   LEUKOCYTESUR Negative 11/24/2016 1015   LEUKOCYTESUR Negative 06/21/2011 0451    Pertinent  Imaging: Renal ultrasound and CT reviewed as above  Assessment & Plan:   1.  Urinary retention 2.  History of bilateral hydronephrosis due to incomplete bladder emptying I discussed with the patient that her hydronephrosis has resolved with complete bladder drainage  Given that she did not have significant voiding signs at baseline but had severe incomplete bladder emptying causing bilateral hydronephrosis, I have recommended that she undergo urodynamic testing.  We also discussed continuing her Foley versus CIC. She is not bothered by her Foley at this point and would prefer continuing it.  She may be interested in CIC in the future if this is a permanent problem.  She will follow-up with [place] after undergoing urodynamic testing  Return for after UDS.  Nickie Retort, MD  [facility] [address], [place], [place] [address] [phone]

## 2017-01-01 DIAGNOSIS — R3914 Feeling of incomplete bladder emptying: Secondary | ICD-10-CM | POA: Diagnosis not present

## 2017-01-02 ENCOUNTER — Other Ambulatory Visit: Payer: Self-pay | Admitting: Urology

## 2017-01-04 ENCOUNTER — Ambulatory Visit: Payer: Medicare HMO

## 2017-01-04 ENCOUNTER — Encounter: Payer: Self-pay | Admitting: Urology

## 2017-01-04 ENCOUNTER — Ambulatory Visit: Payer: Medicare HMO | Admitting: Urology

## 2017-01-04 ENCOUNTER — Other Ambulatory Visit: Payer: Self-pay

## 2017-01-04 VITALS — BP 137/82 | HR 85 | Ht 60.5 in | Wt 90.9 lb

## 2017-01-04 DIAGNOSIS — R339 Retention of urine, unspecified: Secondary | ICD-10-CM | POA: Diagnosis not present

## 2017-01-04 NOTE — Progress Notes (Signed)
01/04/2017 4:28 PM   Brenda Rocha May 18, 1945 [id number]  Referring provider: Valerie Roys, DO [place], [place] [address]  Chief Complaint  Patient presents with  . Urinary Retention    HPI: The patient is a 71 year old female with past medical history of bilateral hydroureteronephrosis and markedly distended bladder presents today for follow-up.  She was found to have bilateral hydronephrosis with a distended bladder and a CT scan that was performed after having left lower quadrant pain she also underwent cystoscopy which was remarkable only for a very distended bladder with also noted bladder trabeculation and saccules. A Foley catheter was placed in attempt to  decompressor upper and lower urinary tract.  She  underwent a renal ultrasound which did show complete resolution of her hydronephrosis as well as the Foley catheter in place.  She is tolerating the Foley well.  She has previously noted seeing "Dr. Redmond Baseman" for a bladder "growth". She underwent taking a "growth off the bladder but there was a concern for a "fistula". Then 8 weeks later she went back for hysterectomy. This was when she was in her 53's.She's noticed no mass or prolapse symptoms. She also noted on a recent CT to have hyperemia of the anterior vaginal wall.  There was no mass palpable on exam.  It is likely from pelvic floor laxity per the radiologist  Urodynamic Studies: Max capacity: 300 cc First sensation 87 cc Positive instability during the filling phase noted Was able to generate detrusor contractions but they were nonsustained. PVR 249 cc Trabeculation noted with no reflux Maximum flow rate 9 cc/s Detrusor pressure max 22 cm of water She did not have appearance he will complete control of her contractions.  None of her contractions were stained     PMH: Past Medical History:  Diagnosis Date  . Breast cancer, right [facility]) 2003   Bilateral mastectomies.   Marland Kitchen COPD (chronic obstructive  pulmonary disease) ([place])   . Neurofibromatosis [facility])     Surgical History: Past Surgical History:  Procedure Laterality Date  . ABDOMINAL HYSTERECTOMY    . BREAST SURGERY     mastectomy  . CHOLECYSTECTOMY    . MASTECTOMY Bilateral     Home Medications:  Allergies as of 01/04/2017   No Known Allergies     Medication List        Accurate as of 01/04/17  4:28 PM. Always use your most recent med list.          albuterol 108 (90 Base) MCG/ACT inhaler Commonly known as:  PROVENTIL HFA;VENTOLIN HFA Inhale 2 puffs into the lungs every 6 (six) hours as needed for wheezing or shortness of breath.   tiotropium 18 MCG inhalation capsule Commonly known as:  SPIRIVA HANDIHALER Place 1 capsule (18 mcg total) into inhaler and inhale daily.       Allergies: No Known Allergies  Family History: Family History  Problem Relation Age of Onset  . Cancer Mother        breast  . Heart disease Mother   . Thyroid disease Mother   . Hypertension Mother   . Osteoporosis Mother   . Hypertension Sister   . Hypertension Brother   . Cancer Maternal Grandmother        breast , bone  . Bladder Cancer Neg Hx   . Kidney cancer Neg Hx     Social History:  reports that she has been smoking cigarettes.  She has been smoking about 0.25 packs per  day. she has never used smokeless tobacco. She reports that she drinks alcohol. She reports that she does not use drugs.  ROS: UROLOGY Frequent Urination?: No Hard to postpone urination?: No Burning/pain with urination?: No Get up at night to urinate?: No Leakage of urine?: No Urine stream starts and stops?: No Trouble starting stream?: No Do you have to strain to urinate?: No Blood in urine?: No Urinary tract infection?: No Sexually transmitted disease?: No Injury to kidneys or bladder?: No Painful intercourse?: No Weak stream?: No Currently pregnant?: No Vaginal bleeding?: No Last menstrual period?: n  Gastrointestinal Nausea?:  No Vomiting?: No Indigestion/heartburn?: No Diarrhea?: No Constipation?: No  Constitutional Fever: No Night sweats?: No Weight loss?: No Fatigue?: No  Skin Skin rash/lesions?: No Itching?: No  Eyes Blurred vision?: No Double vision?: No  Ears/Nose/Throat Sore throat?: No Sinus problems?: No  Hematologic/Lymphatic Swollen glands?: No Easy bruising?: No  Cardiovascular Leg swelling?: No Chest pain?: No  Respiratory Cough?: No Shortness of breath?: No  Endocrine Excessive thirst?: No  Musculoskeletal Back pain?: No Joint pain?: No  Neurological Headaches?: No Dizziness?: No  Psychologic Depression?: No Anxiety?: No  Physical Exam: BP 137/82   Pulse 85   Ht 5' 0.5" (1.537 m)   Wt 90 lb 14.4 oz (41.2 kg)   BMI 17.46 kg/m   Constitutional:  Alert and oriented, No acute distress. HEENT: [place] AT, moist mucus membranes.  Trachea midline, no masses. Cardiovascular: No clubbing, cyanosis, or edema. Respiratory: Normal respiratory effort, no increased work of breathing. GI: Abdomen is soft, nontender, nondistended, no abdominal masses GU: No CVA tenderness.  Skin: No rashes, bruises or suspicious lesions. Lymph: No cervical or inguinal adenopathy. Neurologic: Grossly intact, no focal deficits, moving all 4 extremities. Psychiatric: Normal mood and affect.  Laboratory Data: Lab Results  Component Value Date   WBC 10.1 10/07/2016   HGB 14.9 10/07/2016   HCT 43.8 10/07/2016   MCV 88.8 10/07/2016   PLT 258 10/07/2016    Lab Results  Component Value Date   CREATININE 0.70 11/21/2016    No results found for: PSA  No results found for: TESTOSTERONE  No results found for: HGBA1C  Urinalysis    Component Value Date/Time   COLORURINE YELLOW (A) 10/07/2016 1552   APPEARANCEUR Cloudy (A) 11/24/2016 1015   LABSPEC 1.016 10/07/2016 1552   LABSPEC 1.016 06/21/2011 0451   PHURINE 5.0 10/07/2016 1552   GLUCOSEU Negative 11/24/2016 1015   GLUCOSEU  Negative 06/21/2011 0451   HGBUR NEGATIVE 10/07/2016 1552   BILIRUBINUR Negative 11/24/2016 1015   BILIRUBINUR Negative 06/21/2011 0451   KETONESUR NEGATIVE 10/07/2016 1552   PROTEINUR Negative 11/24/2016 1015   PROTEINUR NEGATIVE 10/07/2016 1552   NITRITE Negative 11/24/2016 1015   NITRITE NEGATIVE 10/07/2016 1552   LEUKOCYTESUR Negative 11/24/2016 1015   LEUKOCYTESUR Negative 06/21/2011 0451    Assessment & Plan:    1. Neurogenic bladder I discussed the patient that she has a fairly low bladder capacity of only 300 cc and she only is able to void 50 cc leaving a high PVR.  We also discussed her bladder instability during filling as well as her bilateral hydroureteronephrosis previously to complete bladder drainage.  I am concerned that she may develop renal insufficiency if she does not have proper drainage of her bladder.  We discussed CIC versus Foley catheter.  She attempted to learn CIC at our office today but was unable to perform this.  She requests the Foley catheter be replaced.  We will  have her follow-up in 1 month for early a.m. appointment and possibly considering a trial of void.  We need to keep an extremely close eye on her kidneys if we did do this to ensure no recurrent development of hydronephrosis.  Return in about 4 weeks (around 02/01/2017) for early AM appt for possible TOV.  Nickie Retort, MD  [facility] [address] [place], [place] [address] [phone]

## 2017-02-02 ENCOUNTER — Encounter: Payer: Self-pay | Admitting: Urology

## 2017-02-02 ENCOUNTER — Ambulatory Visit: Payer: Medicare HMO | Admitting: Urology

## 2017-02-02 VITALS — BP 136/88 | HR 96 | Ht 60.0 in | Wt 94.0 lb

## 2017-02-02 DIAGNOSIS — N1330 Unspecified hydronephrosis: Secondary | ICD-10-CM | POA: Diagnosis not present

## 2017-02-02 DIAGNOSIS — R339 Retention of urine, unspecified: Secondary | ICD-10-CM | POA: Diagnosis not present

## 2017-02-02 NOTE — Progress Notes (Signed)
02/02/2017 9:14 AM   Brenda Rocha 04-30-1945 [id number]  Referring provider: Valerie Roys, DO [place], [place] [address]  Chief Complaint  Patient presents with  . Urinary Retention    HPI: The patient is a 72 year old female with past medical history of bilateral hydroureteronephrosis and markedly distended bladder presents today for follow-up. She was found to have bilateral hydronephrosis with a distended bladder and a CT scan that was performed after having left lower quadrant pain. She also underwent cystoscopy which was remarkable only for a very distended bladder with also noted bladder trabeculation and saccules. A Foley catheter was placed in attempt to decompress her upper and lower urinary tract. Sheunderwent a renal ultrasound which did show complete resolution of her hydronephrosis as well as the Foley catheter in place. She is tolerating the Foley well.  She failed a trial of void at her last visit 1 month ago.  She was unable to tolerate performing CIC, so foley was replaced.  She however had a full and pull today with 170 cc placed within her bladder and she was able to void 150 cc.  Shehas previously notedseeing "Dr. Redmond Baseman" for a bladder "growth". She underwent taking a "growth off the bladder but there was a concern for a "fistula". Then 8 weeks later she went back for hysterectomy. This was when she was in her 54's.She's noticed no mass or prolapse symptoms. She also noted on a recent CT to have hyperemia of the anterior vaginal wall. There was no mass palpable on exam. It is likely from pelvic floor laxity per the radiologist  Urodynamic Studies: Max capacity: 300 cc First sensation 87 cc Positive instability during the filling phase noted Was able to generate detrusor contractions but they were nonsustained. PVR 249 cc Trabeculation noted with no reflux Maximum flow rate 9 cc/s Detrusor pressure max 22 cm of water She did not have  appearance he will complete control of her contractions.  None of her contractions were sustained     PMH: Past Medical History:  Diagnosis Date  . Breast cancer, right [facility]) 2003   Bilateral mastectomies.   Marland Kitchen COPD (chronic obstructive pulmonary disease) ([place])   . Neurofibromatosis [facility])     Surgical History: Past Surgical History:  Procedure Laterality Date  . ABDOMINAL HYSTERECTOMY    . BREAST SURGERY     mastectomy  . CHOLECYSTECTOMY    . MASTECTOMY Bilateral     Home Medications:  Allergies as of 02/02/2017   No Known Allergies     Medication List        Accurate as of 02/02/17  9:14 AM. Always use your most recent med list.          albuterol 108 (90 Base) MCG/ACT inhaler Commonly known as:  PROVENTIL HFA;VENTOLIN HFA Inhale 2 puffs into the lungs every 6 (six) hours as needed for wheezing or shortness of breath.   tiotropium 18 MCG inhalation capsule Commonly known as:  SPIRIVA HANDIHALER Place 1 capsule (18 mcg total) into inhaler and inhale daily.       Allergies: No Known Allergies  Family History: Family History  Problem Relation Age of Onset  . Cancer Mother        breast  . Heart disease Mother   . Thyroid disease Mother   . Hypertension Mother   . Osteoporosis Mother   . Hypertension Sister   . Hypertension Brother   . Cancer Maternal Grandmother  breast , bone  . Bladder Cancer Neg Hx   . Kidney cancer Neg Hx     Social History:  reports that she has been smoking cigarettes.  She has been smoking about 0.25 packs per day. she has never used smokeless tobacco. She reports that she drinks alcohol. She reports that she does not use drugs.  ROS: UROLOGY Frequent Urination?: No Hard to postpone urination?: No Burning/pain with urination?: No Get up at night to urinate?: No Leakage of urine?: No Urine stream starts and stops?: No Trouble starting stream?: No Do you have to strain to urinate?: No Blood in urine?: No Urinary  tract infection?: No Sexually transmitted disease?: No Injury to kidneys or bladder?: No Painful intercourse?: No Weak stream?: No Currently pregnant?: No Vaginal bleeding?: No Last menstrual period?: n  Gastrointestinal Nausea?: No Vomiting?: No Indigestion/heartburn?: No Diarrhea?: No Constipation?: No  Constitutional Fever: No Night sweats?: No Weight loss?: No Fatigue?: No  Skin Skin rash/lesions?: No Itching?: No  Eyes Blurred vision?: No Double vision?: No  Ears/Nose/Throat Sore throat?: No Sinus problems?: No  Hematologic/Lymphatic Swollen glands?: No Easy bruising?: No  Cardiovascular Leg swelling?: No Chest pain?: No  Respiratory Cough?: No Shortness of breath?: No  Endocrine Excessive thirst?: No  Musculoskeletal Back pain?: No Joint pain?: No  Neurological Headaches?: No Dizziness?: No  Psychologic Depression?: No Anxiety?: No  Physical Exam: BP 136/88   Pulse 96   Ht 5' (1.524 m)   Wt 94 lb (42.6 kg)   BMI 18.36 kg/m   Constitutional:  Alert and oriented, No acute distress. HEENT: [place] AT, moist mucus membranes.  Trachea midline, no masses. Cardiovascular: No clubbing, cyanosis, or edema. Respiratory: Normal respiratory effort, no increased work of breathing. GI: Abdomen is soft, nontender, nondistended, no abdominal masses GU: No CVA tenderness.  Skin: No rashes, bruises or suspicious lesions. Lymph: No cervical or inguinal adenopathy. Neurologic: Grossly intact, no focal deficits, moving all 4 extremities. Psychiatric: Normal mood and affect.  Laboratory Data: Lab Results  Component Value Date   WBC 10.1 10/07/2016   HGB 14.9 10/07/2016   HCT 43.8 10/07/2016   MCV 88.8 10/07/2016   PLT 258 10/07/2016    Lab Results  Component Value Date   CREATININE 0.70 11/21/2016    No results found for: PSA  No results found for: TESTOSTERONE  No results found for: HGBA1C  Urinalysis    Component Value Date/Time    COLORURINE YELLOW (A) 10/07/2016 1552   APPEARANCEUR Cloudy (A) 11/24/2016 1015   LABSPEC 1.016 10/07/2016 1552   LABSPEC 1.016 06/21/2011 0451   PHURINE 5.0 10/07/2016 1552   GLUCOSEU Negative 11/24/2016 1015   GLUCOSEU Negative 06/21/2011 0451   HGBUR NEGATIVE 10/07/2016 1552   BILIRUBINUR Negative 11/24/2016 1015   BILIRUBINUR Negative 06/21/2011 0451   KETONESUR NEGATIVE 10/07/2016 1552   PROTEINUR Negative 11/24/2016 1015   PROTEINUR NEGATIVE 10/07/2016 1552   NITRITE Negative 11/24/2016 1015   NITRITE NEGATIVE 10/07/2016 1552   LEUKOCYTESUR Negative 11/24/2016 1015   LEUKOCYTESUR Negative 06/21/2011 0451     Assessment & Plan:    1.  History of bilateral hydronephrosis 2.  History of urinary retention I did discuss with the patient that she did pass her trial of void.  However, and still concerned given her urodynamic studies and history of high PVR resulting in bilateral hydronephrosis that this could redevelop.  I have recommended that she continue to void spontaneously.  We will have her follow-up in 1 month with a  renal ultrasound prior to ensure that she is still doing well.  Return in about 4 weeks (around 03/02/2017) for renal u/s prior.  Nickie Retort, MD  [facility] [address] [place], [place] [address] [phone]

## 2017-02-02 NOTE — Progress Notes (Signed)
Fill and Pull Catheter Removal  Patient is present today for a catheter removal.  Patient was cleaned and prepped in a sterile fashion 127ml of sterile water/ saline was instilled into the bladder when the patient felt the urge to urinate. 82ml of water was then drained from the balloon.  A 16FR foley cath was removed from the bladder no complications were noted .  Patient as then given some time to void on their own.  Patient can void  134ml on their own after some time.  Patient tolerated well.  Preformed by: Toniann Fail, LPN

## 2017-02-08 ENCOUNTER — Ambulatory Visit (INDEPENDENT_AMBULATORY_CARE_PROVIDER_SITE_OTHER): Payer: Medicare HMO | Admitting: Family Medicine

## 2017-02-08 ENCOUNTER — Encounter: Payer: Self-pay | Admitting: Family Medicine

## 2017-02-08 VITALS — BP 136/72 | HR 79 | Temp 97.4°F | Wt 94.8 lb

## 2017-02-08 DIAGNOSIS — E782 Mixed hyperlipidemia: Secondary | ICD-10-CM | POA: Diagnosis not present

## 2017-02-08 DIAGNOSIS — J449 Chronic obstructive pulmonary disease, unspecified: Secondary | ICD-10-CM | POA: Diagnosis not present

## 2017-02-08 DIAGNOSIS — Z1159 Encounter for screening for other viral diseases: Secondary | ICD-10-CM | POA: Diagnosis not present

## 2017-02-08 LAB — CBC WITH DIFFERENTIAL/PLATELET
HEMOGLOBIN: 14.9 g/dL (ref 11.1–15.9)
Hematocrit: 44.3 % (ref 34.0–46.6)
Lymphocytes Absolute: 1.3 10*3/uL (ref 0.7–3.1)
Lymphs: 16 %
MCH: 30.5 pg (ref 26.6–33.0)
MCHC: 33.6 g/dL (ref 31.5–35.7)
MCV: 91 fL (ref 79–97)
MID (Absolute): 0.8 10*3/uL (ref 0.1–1.6)
MID: 11 %
Neutrophils Absolute: 5.6 10*3/uL (ref 1.4–7.0)
Neutrophils: 73 %
PLATELETS: 283 10*3/uL (ref 150–379)
RBC: 4.89 x10E6/uL (ref 3.77–5.28)
RDW: 14.2 % (ref 12.3–15.4)
WBC: 7.7 10*3/uL (ref 3.4–10.8)

## 2017-02-08 MED ORDER — ALBUTEROL SULFATE HFA 108 (90 BASE) MCG/ACT IN AERS: 2.0000 | INHALATION_SPRAY | Freq: Four times a day (QID) | RESPIRATORY_TRACT | 6 refills | Status: DC | PRN

## 2017-02-08 MED ORDER — TIOTROPIUM BROMIDE MONOHYDRATE 18 MCG IN CAPS: 18.0000 ug | ORAL_CAPSULE | Freq: Every day | RESPIRATORY_TRACT | 12 refills | Status: DC

## 2017-02-08 NOTE — Progress Notes (Signed)
BP 136/72   Pulse 79   Temp (!) 97.4 F (36.3 C)   Wt 94 lb 12.8 oz (43 kg)   SpO2 96%   BMI 18.51 kg/m    Subjective:    Patient ID: Brenda Rocha, female    DOB: 1945-05-01, 72 y.o.   MRN: [id number]  HPI: Brenda Rocha is a 72 y.o. female  Chief Complaint  Patient presents with  . COPD  . Hyperlipidemia   Has been seeing urology for hydronephrosis. Has been having tests. Still not feeling like herself.  COPD COPD status: controlled Satisfied with current treatment?: no Oxygen use: no Dyspnea frequency: rarely Cough frequency: occasionally Rescue inhaler frequency:  rarely Limitation of activity: no Productive cough: no Pneumovax: Up to Date Influenza: Up to Date  HYPERLIPIDEMIA Hyperlipidemia status: Stable Satisfied with current treatment?  yes Side effects:  Not on anything Supplements: none Aspirin:  no The 10-year ASCVD risk score Mikey Bussing DC Jr., et al., 2013) is: 19.4%   Values used to calculate the score:     Age: 50 years     Sex: Female     Is Non-Hispanic African American: No     Diabetic: No     Tobacco smoker: Yes     Systolic Blood Pressure: 735 mmHg     Is BP treated: No     HDL Cholesterol: 73 mg/dL     Total Cholesterol: 205 mg/dL Chest pain:  no Coronary artery disease:  no  Relevant past medical, surgical, family and social history reviewed and updated as indicated. Interim medical history since our last visit reviewed. Allergies and medications reviewed and updated.  Review of Systems  Constitutional: Negative.   Respiratory: Positive for cough. Negative for apnea, choking, chest tightness, shortness of breath, wheezing and stridor.   Cardiovascular: Negative.   Genitourinary: Positive for decreased urine volume and flank pain. Negative for difficulty urinating, dyspareunia, dysuria, enuresis, frequency, genital sores, hematuria, menstrual problem, pelvic pain, urgency, vaginal bleeding, vaginal discharge and vaginal pain.    Psychiatric/Behavioral: Negative.     Per HPI unless specifically indicated above     Objective:    BP 136/72   Pulse 79   Temp (!) 97.4 F (36.3 C)   Wt 94 lb 12.8 oz (43 kg)   SpO2 96%   BMI 18.51 kg/m   Wt Readings from Last 3 Encounters:  02/08/17 94 lb 12.8 oz (43 kg)  02/02/17 94 lb (42.6 kg)  01/04/17 90 lb 14.4 oz (41.2 kg)    Physical Exam  Constitutional: She is oriented to person, place, and time. She appears well-developed and well-nourished. No distress.  HENT:  Head: Normocephalic and atraumatic.  Right Ear: Hearing normal.  Left Ear: Hearing normal.  Nose: Nose normal.  Eyes: Conjunctivae and lids are normal. Right eye exhibits no discharge. Left eye exhibits no discharge. No scleral icterus.  Cardiovascular: Normal rate, regular rhythm, normal heart sounds and intact distal pulses. Exam reveals no gallop and no friction rub.  No murmur heard. Pulmonary/Chest: Effort normal and breath sounds normal. No respiratory distress. She has no wheezes. She has no rales. She exhibits no tenderness.  Musculoskeletal: Normal range of motion.  Neurological: She is alert and oriented to person, place, and time.  Skin: Skin is warm, dry and intact. No rash noted. She is not diaphoretic. No erythema. No pallor.  Psychiatric: She has a normal mood and affect. Her speech is normal and behavior is normal. Judgment and thought content  normal. Cognition and memory are normal.  Nursing note and vitals reviewed.   Results for orders placed or performed in visit on 11/24/16  Microscopic Examination  Result Value Ref Range   WBC, UA None seen 0 - 5 /hpf   RBC, UA None seen 0 - 2 /hpf   Epithelial Cells (non renal) 0-10 0 - 10 /hpf   Bacteria, UA Many (A) None seen/Few  Urinalysis, Complete  Result Value Ref Range   Specific Gravity, UA 1.020 1.005 - 1.030   pH, UA 5.5 5.0 - 7.5   Color, UA Yellow Yellow   Appearance Ur Cloudy (A) Clear   Leukocytes, UA Negative Negative    Protein, UA Negative Negative/Trace   Glucose, UA Negative Negative   Ketones, UA Negative Negative   RBC, UA Negative Negative   Bilirubin, UA Negative Negative   Urobilinogen, Ur 0.2 0.2 - 1.0 mg/dL   Nitrite, UA Negative Negative   Microscopic Examination See below:   Bladder Scan (Post Void Residual) in office  Result Value Ref Range   Scan Result 263       Assessment & Plan:   Problem List Items Addressed This Visit      Respiratory   COLD (chronic obstructive lung disease) ([place]) - Primary    Stable on her current regimen. Continue to monitor. Call with any concerns. Refills given today.      Relevant Orders   CBC with Differential/Platelet     Other   Hyperlipidemia    Stable. Rechecking levels. Await results. Call with any concerns.       Relevant Orders   Comprehensive metabolic panel   Lipid Panel w/o Chol/HDL Ratio    Other Visit Diagnoses    Encounter for hepatitis C screening test for low risk patient       Labs drawn today. Await results. Call with any concerns.    Relevant Orders   Hepatitis C Antibody       Follow up plan: Return in about 6 months (around 08/08/2017) for Wellness.

## 2017-02-08 NOTE — Addendum Note (Signed)
Addended by: Valerie Roys on: 02/08/2017 03:21 PM   Modules accepted: Orders

## 2017-02-08 NOTE — Assessment & Plan Note (Signed)
Stable. Rechecking levels. Await results. Call with any concerns.

## 2017-02-08 NOTE — Addendum Note (Signed)
Addended by: Valerie Roys on: 02/08/2017 03:08 PM   Modules accepted: Orders

## 2017-02-08 NOTE — Assessment & Plan Note (Signed)
Stable on her current regimen. Continue to monitor. Call with any concerns. Refills given today.

## 2017-02-09 ENCOUNTER — Encounter: Payer: Self-pay | Admitting: Family Medicine

## 2017-02-09 LAB — COMPREHENSIVE METABOLIC PANEL
A/G RATIO: 1.5 (ref 1.2–2.2)
ALBUMIN: 4.1 g/dL (ref 3.5–4.8)
ALT: 7 IU/L (ref 0–32)
AST: 13 IU/L (ref 0–40)
Alkaline Phosphatase: 109 IU/L (ref 39–117)
BUN/Creatinine Ratio: 16 (ref 12–28)
BUN: 11 mg/dL (ref 8–27)
Bilirubin Total: 0.5 mg/dL (ref 0.0–1.2)
CALCIUM: 9.7 mg/dL (ref 8.7–10.3)
CO2: 23 mmol/L (ref 20–29)
CREATININE: 0.69 mg/dL (ref 0.57–1.00)
Chloride: 103 mmol/L (ref 96–106)
GFR, EST AFRICAN AMERICAN: 101 mL/min/{1.73_m2} (ref >59)
GFR, EST NON AFRICAN AMERICAN: 87 mL/min/{1.73_m2} (ref >59)
GLOBULIN, TOTAL: 2.8 g/dL (ref 1.5–4.5)
Glucose: 98 mg/dL (ref 65–99)
POTASSIUM: 4.9 mmol/L (ref 3.5–5.2)
SODIUM: 143 mmol/L (ref 134–144)
TOTAL PROTEIN: 6.9 g/dL (ref 6.0–8.5)

## 2017-02-09 LAB — HEPATITIS C ANTIBODY

## 2017-02-09 LAB — LIPID PANEL W/O CHOL/HDL RATIO
Cholesterol, Total: 193 mg/dL (ref 100–199)
HDL: 63 mg/dL (ref >39)
LDL Calculated: 107 mg/dL — ABNORMAL HIGH (ref 0–99)
TRIGLYCERIDES: 117 mg/dL (ref 0–149)
VLDL Cholesterol Cal: 23 mg/dL (ref 5–40)

## 2017-02-21 ENCOUNTER — Ambulatory Visit
Admission: RE | Admit: 2017-02-21 | Discharge: 2017-02-21 | Disposition: A | Discharge status: Home / Self Care | Payer: Medicare HMO | Source: Ambulatory Visit | Attending: Urology | Admitting: Urology

## 2017-02-21 DIAGNOSIS — R339 Retention of urine, unspecified: Secondary | ICD-10-CM | POA: Diagnosis not present

## 2017-02-21 DIAGNOSIS — N1330 Unspecified hydronephrosis: Secondary | ICD-10-CM | POA: Diagnosis not present

## 2017-03-02 ENCOUNTER — Encounter: Payer: Self-pay | Admitting: Urology

## 2017-03-02 ENCOUNTER — Ambulatory Visit (INDEPENDENT_AMBULATORY_CARE_PROVIDER_SITE_OTHER): Payer: Medicare HMO | Admitting: Urology

## 2017-03-02 VITALS — BP 128/84 | HR 112 | Ht 62.0 in | Wt 94.0 lb

## 2017-03-02 DIAGNOSIS — R339 Retention of urine, unspecified: Secondary | ICD-10-CM | POA: Diagnosis not present

## 2017-03-02 NOTE — Progress Notes (Signed)
03/02/2017 10:50 AM   Brenda Rocha December 07, 1945 [id number]  Referring provider: Valerie Roys, DO [place], [place] [address]  Chief Complaint  Patient presents with  . Follow-up    HPI: The patient is a 72 year old female with past medical history of bilateral hydroureteronephrosis and markedly distended bladder presents today for follow-up after passing a trial of void. She was originally found to have bilateral hydronephrosis with a distended bladder on a CT scan that was performed after having left lower quadrant pain. She also underwent cystoscopy which was remarkable only for a very distended bladder with also noted bladder trabeculation and saccules.AFoley catheter was placed in attempt todecompress her upper and lower urinary tract. Sheunderwent a renal ultrasound which did show complete resolution of her hydronephrosis as well as the Foley catheter in place. She is tolerating the Foley well.  She failed a trial of void at her last visit 1 month ago.  She was unable to tolerate performing CIC, so foley was replaced.  She however had a full and pull with 170 cc placed within her bladder and she was able to void 150 cc.  Due to her history of high volume urinary retention the bilateral hydroureteronephrosis as well as her urodynamic studies, she returns today after undergoing a repeat renal ultrasound to rule out return of this condition.  There is no significant hydronephrosis though there is mild right-sided pelvocaliectasis.  Her prevoid bladder volume is 98 cc postvoid residual is 77 cc.   Shehas previously notedseeing "Dr. Redmond Baseman" for a bladder "growth". She underwent taking a "growth off the bladder but there was a concern for a "fistula". Then 8 weeks later she went back for hysterectomy. This was when she was in her 53's.She's noticed no mass or prolapse symptoms. She also noted on a recent CT to have hyperemia of the anterior vaginal wall. There was no  mass palpable on exam. It is likely from pelvic floor laxity per the radiologist  Urodynamic Studies: Max capacity: 300 cc First sensation 87 cc Positive instability during the filling phase noted Was able to generate detrusor contractions but they were nonsustained. PVR 249 cc Trabeculation noted with no reflux Maximum flow rate 9 cc/s Detrusor pressure max 22 cm of water She did not have appearance he will complete control of her contractions. None of her contractions were sustained        PMH: Past Medical History:  Diagnosis Date  . Breast cancer, right [facility]) 2003   Bilateral mastectomies.   Marland Kitchen COPD (chronic obstructive pulmonary disease) ([place])   . Neurofibromatosis [facility])     Surgical History: Past Surgical History:  Procedure Laterality Date  . ABDOMINAL HYSTERECTOMY    . BREAST SURGERY     mastectomy  . CHOLECYSTECTOMY    . MASTECTOMY Bilateral     Home Medications:  Allergies as of 03/02/2017   No Known Allergies     Medication List        Accurate as of 03/02/17 10:50 AM. Always use your most recent med list.          albuterol 108 (90 Base) MCG/ACT inhaler Commonly known as:  PROVENTIL HFA;VENTOLIN HFA Inhale 2 puffs into the lungs every 6 (six) hours as needed for wheezing or shortness of breath.   tiotropium 18 MCG inhalation capsule Commonly known as:  SPIRIVA HANDIHALER Place 1 capsule (18 mcg total) into inhaler and inhale daily.       Allergies: No Known Allergies  Family History: Family History  Problem Relation Age of Onset  . Cancer Mother        breast  . Heart disease Mother   . Thyroid disease Mother   . Hypertension Mother   . Osteoporosis Mother   . Hypertension Sister   . Hypertension Brother   . Cancer Maternal Grandmother        breast , bone  . Bladder Cancer Neg Hx   . Kidney cancer Neg Hx     Social History:  reports that she has been smoking cigarettes.  She has been smoking about 0.25 packs per day. she  has never used smokeless tobacco. She reports that she drinks alcohol. She reports that she does not use drugs.  ROS: UROLOGY Frequent Urination?: No Hard to postpone urination?: No Burning/pain with urination?: No Get up at night to urinate?: No Leakage of urine?: No Urine stream starts and stops?: No Trouble starting stream?: No Do you have to strain to urinate?: No Blood in urine?: No Urinary tract infection?: No Sexually transmitted disease?: No Injury to kidneys or bladder?: No Painful intercourse?: No Weak stream?: No Currently pregnant?: No Vaginal bleeding?: No Last menstrual period?: n  Gastrointestinal Nausea?: No Vomiting?: No Indigestion/heartburn?: No Diarrhea?: No Constipation?: No  Constitutional Fever: No Night sweats?: No Weight loss?: No Fatigue?: No  Skin Skin rash/lesions?: No Itching?: No  Eyes Blurred vision?: No Double vision?: No  Ears/Nose/Throat Sore throat?: No Sinus problems?: No  Hematologic/Lymphatic Swollen glands?: No Easy bruising?: No  Cardiovascular Leg swelling?: No Chest pain?: No  Respiratory Cough?: No Shortness of breath?: No  Endocrine Excessive thirst?: No  Musculoskeletal Back pain?: No Joint pain?: No  Neurological Headaches?: No Dizziness?: No  Psychologic Depression?: No Anxiety?: No  Physical Exam: BP 128/84   Pulse (!) 112   Ht 5\' 2"  (1.575 m)   Wt 94 lb (42.6 kg)   BMI 17.19 kg/m   Constitutional:  Alert and oriented, No acute distress. HEENT: [place] AT, moist mucus membranes.  Trachea midline, no masses. Cardiovascular: No clubbing, cyanosis, or edema. Respiratory: Normal respiratory effort, no increased work of breathing. GI: Abdomen is soft, nontender, nondistended, no abdominal masses GU: No CVA tenderness.  Skin: No rashes, bruises or suspicious lesions. Lymph: No cervical or inguinal adenopathy. Neurologic: Grossly intact, no focal deficits, moving all 4  extremities. Psychiatric: Normal mood and affect.  Laboratory Data: Lab Results  Component Value Date   WBC 7.7 02/08/2017   HGB 14.9 02/08/2017   HCT 44.3 02/08/2017   MCV 91 02/08/2017   PLT 283 02/08/2017    Lab Results  Component Value Date   CREATININE 0.69 02/08/2017    No results found for: PSA  No results found for: TESTOSTERONE  No results found for: HGBA1C  Urinalysis    Component Value Date/Time   COLORURINE YELLOW (A) 10/07/2016 1552   APPEARANCEUR Cloudy (A) 11/24/2016 1015   LABSPEC 1.016 10/07/2016 1552   LABSPEC 1.016 06/21/2011 0451   PHURINE 5.0 10/07/2016 1552   GLUCOSEU Negative 11/24/2016 1015   GLUCOSEU Negative 06/21/2011 0451   HGBUR NEGATIVE 10/07/2016 1552   BILIRUBINUR Negative 11/24/2016 1015   BILIRUBINUR Negative 06/21/2011 0451   KETONESUR NEGATIVE 10/07/2016 1552   PROTEINUR Negative 11/24/2016 1015   PROTEINUR NEGATIVE 10/07/2016 1552   NITRITE Negative 11/24/2016 1015   NITRITE NEGATIVE 10/07/2016 1552   LEUKOCYTESUR Negative 11/24/2016 1015   LEUKOCYTESUR Negative 06/21/2011 0451    Pertinent Imaging: RUS reviewed as above  Assessment &  Plan:    1.  History of bilateral hydronephrosis 2.  History of urinary retention This has appeared to resolve on multiple imaging studies.  We will have the patient follow-up in 6 months for a PVR to ensure this continues.  Return in about 6 months (around 08/30/2017) for PVR.  Nickie Retort, MD  [facility] [address] [place], [place] [address] [phone]

## 2017-03-29 DIAGNOSIS — Z0100 Encounter for examination of eyes and vision without abnormal findings: Secondary | ICD-10-CM | POA: Diagnosis not present

## 2017-03-29 DIAGNOSIS — H5213 Myopia, bilateral: Secondary | ICD-10-CM | POA: Diagnosis not present

## 2017-07-03 DIAGNOSIS — E28319 Asymptomatic premature menopause: Secondary | ICD-10-CM | POA: Diagnosis not present

## 2017-07-03 DIAGNOSIS — R636 Underweight: Secondary | ICD-10-CM | POA: Diagnosis not present

## 2017-07-03 DIAGNOSIS — F17200 Nicotine dependence, unspecified, uncomplicated: Secondary | ICD-10-CM | POA: Diagnosis not present

## 2017-07-03 DIAGNOSIS — M810 Age-related osteoporosis without current pathological fracture: Secondary | ICD-10-CM | POA: Diagnosis not present

## 2017-08-13 ENCOUNTER — Ambulatory Visit: Payer: Medicare HMO

## 2017-08-14 ENCOUNTER — Encounter: Payer: Medicare HMO | Admitting: Family Medicine

## 2017-08-30 ENCOUNTER — Ambulatory Visit: Payer: Medicare HMO | Admitting: Urology

## 2017-09-04 ENCOUNTER — Ambulatory Visit: Payer: Medicare HMO | Admitting: Urology

## 2017-09-04 ENCOUNTER — Encounter: Payer: Self-pay | Admitting: Urology

## 2017-09-04 VITALS — BP 118/76 | HR 82 | Ht 62.0 in | Wt 88.1 lb

## 2017-09-04 DIAGNOSIS — R339 Retention of urine, unspecified: Secondary | ICD-10-CM | POA: Diagnosis not present

## 2017-09-04 LAB — URINALYSIS, COMPLETE
BILIRUBIN UA: NEGATIVE
Glucose, UA: NEGATIVE
Ketones, UA: NEGATIVE
Nitrite, UA: NEGATIVE
PH UA: 7 (ref 5.0–7.5)
Specific Gravity, UA: 1.025 (ref 1.005–1.030)
UUROB: 0.2 mg/dL (ref 0.2–1.0)

## 2017-09-04 LAB — MICROSCOPIC EXAMINATION: WBC, UA: >30 /hpf — ABNORMAL HIGH (ref 0–5)

## 2017-09-04 LAB — BLADDER SCAN AMB NON-IMAGING: SCAN RESULT: 52

## 2017-09-04 NOTE — Patient Instructions (Signed)
Empty your bladder every 2 hours during the day and before bed.  Call the office or go to the ER if you are unable to pee or have flank pain

## 2017-09-04 NOTE — Progress Notes (Signed)
   09/04/2017 4:52 PM   ALTHIA EGOLF [birth year] [id number]  Reason for visit: Follow up voiding dysfunction  HPI: I had the pleasure of seeing Ms. Dardis in urology clinic today for follow-up of voiding dysfunction.  Briefly she is a 72 year old female with neurofibromatosis and history of urinary retention with bilateral hydronephrosis previously managed with Foley catheter.  She passed a void trial as an outpatient and has been voiding relatively normally since that time.  CT previously was notable for multiple diverticula of the bladder.  She has had prior cystoscopy which was negative.  She reports she has been doing well, has been voiding without issues.  Denies any gross hematuria or significant flank pain.  Prior urodynamics showed a maximum capacity of 300 cc, first sensation at 87 cc, instability during the filling phase, max flow rate of 9 cc/s, max detrusor pressure of 22 cm of water.  PVR in clinic today was 50 cc.  ROS: Please see flowsheet from today's date for complete review of systems.  Physical Exam: BP 118/76 (BP Location: Left Arm, Patient Position: Sitting, Cuff Size: Normal)   Pulse 82   Ht 5\' 2"  (1.575 m)   Wt 88 lb 1.6 oz (40 kg)   BMI 16.11 kg/m   Constitutional:  Alert and oriented, No acute distress. Respiratory: Normal respiratory effort, no increased work of breathing. GI: Abdomen is soft, nontender, nondistended, no abdominal masses GU: No CVA tenderness Skin: No rashes, bruises or suspicious lesions. Neurologic: Grossly intact, no focal deficits, moving all 4 extremities. Psychiatric: Normal mood and affect  Laboratory Data: Lab Results  Component Value Date   WBC 7.7 02/08/2017   HGB 14.9 02/08/2017   HCT 44.3 02/08/2017   MCV 91 02/08/2017   PLT 283 02/08/2017    Lab Results  Component Value Date   CREATININE 0.69 02/08/2017    Pertinent Imaging: I have personally reviewed the renal ultrasound from February 2019, no significant  hydronephrosis.  Assessment & Plan:   In summary, Ms. Shader is a 72 year old female with neurofibromatosis and history of urinary retention with bilateral hydronephrosis previously managed with Foley catheter.  She has since passed a voiding trial and renal ultrasound in February 2019 showed resolution of her hydronephrosis, and renal function was normal at that time with a creatinine of 0.69.  She is voiding relatively well now with minimal urinary complaints.  We discussed options including intermittent catheterization, however with normal renal function, resolution of her hydronephrosis, and low PVR I recommended continued timed voiding every 2-3 hours during the day.  We will see her back in 6 months with a BMP, renal ultrasound, and PVR.  If abnormal, could consider starting clean intermittent catheterization.   Return in about 6 months (around 03/07/2018) for see me with BMP and renal ultrasound prior, and PVR.  Billey Co, [facility] [address] [place], [place] [address] [phone]

## 2017-09-20 ENCOUNTER — Ambulatory Visit: Payer: Medicare HMO

## 2017-09-24 ENCOUNTER — Ambulatory Visit (INDEPENDENT_AMBULATORY_CARE_PROVIDER_SITE_OTHER): Payer: Medicare HMO | Admitting: Family Medicine

## 2017-09-24 ENCOUNTER — Ambulatory Visit (INDEPENDENT_AMBULATORY_CARE_PROVIDER_SITE_OTHER): Payer: Medicare HMO

## 2017-09-24 ENCOUNTER — Encounter

## 2017-09-24 ENCOUNTER — Encounter: Payer: Self-pay | Admitting: Family Medicine

## 2017-09-24 VITALS — BP 112/64 | HR 69 | Temp 97.8°F | Resp 17 | Ht 61.0 in | Wt 89.3 lb

## 2017-09-24 DIAGNOSIS — Z720 Tobacco use: Secondary | ICD-10-CM

## 2017-09-24 DIAGNOSIS — Z0000 Encounter for general adult medical examination without abnormal findings: Secondary | ICD-10-CM | POA: Diagnosis not present

## 2017-09-24 DIAGNOSIS — N390 Urinary tract infection, site not specified: Secondary | ICD-10-CM

## 2017-09-24 DIAGNOSIS — Z23 Encounter for immunization: Secondary | ICD-10-CM

## 2017-09-24 DIAGNOSIS — M810 Age-related osteoporosis without current pathological fracture: Secondary | ICD-10-CM | POA: Diagnosis not present

## 2017-09-24 DIAGNOSIS — J449 Chronic obstructive pulmonary disease, unspecified: Secondary | ICD-10-CM

## 2017-09-24 DIAGNOSIS — E559 Vitamin D deficiency, unspecified: Secondary | ICD-10-CM | POA: Diagnosis not present

## 2017-09-24 DIAGNOSIS — R300 Dysuria: Secondary | ICD-10-CM | POA: Diagnosis not present

## 2017-09-24 DIAGNOSIS — F17210 Nicotine dependence, cigarettes, uncomplicated: Secondary | ICD-10-CM

## 2017-09-24 DIAGNOSIS — E782 Mixed hyperlipidemia: Secondary | ICD-10-CM | POA: Diagnosis not present

## 2017-09-24 DIAGNOSIS — R8281 Pyuria: Secondary | ICD-10-CM

## 2017-09-24 LAB — UA/M W/RFLX CULTURE, ROUTINE
Bilirubin, UA: NEGATIVE
GLUCOSE, UA: NEGATIVE
Ketones, UA: NEGATIVE
Nitrite, UA: NEGATIVE
Specific Gravity, UA: 1.02 (ref 1.005–1.030)
Urobilinogen, Ur: 0.2 mg/dL (ref 0.2–1.0)
pH, UA: 7 (ref 5.0–7.5)

## 2017-09-24 LAB — MICROSCOPIC EXAMINATION: WBC, UA: >30 /hpf — AB (ref 0–5)

## 2017-09-24 MED ORDER — TIOTROPIUM BROMIDE MONOHYDRATE 18 MCG IN CAPS: 18.0000 ug | ORAL_CAPSULE | Freq: Every day | RESPIRATORY_TRACT | 12 refills | Status: DC

## 2017-09-24 MED ORDER — ALBUTEROL SULFATE HFA 108 (90 BASE) MCG/ACT IN AERS: 2.0000 | INHALATION_SPRAY | Freq: Four times a day (QID) | RESPIRATORY_TRACT | 6 refills | Status: DC | PRN

## 2017-09-24 NOTE — Progress Notes (Signed)
Subjective:   Brenda Rocha is a 72 y.o. female who presents for Medicare Annual (Subsequent) preventive examination.  Review of Systems:   Cardiac Risk Factors include: advanced age (>76men, >20 women);smoking/ tobacco exposure     Objective:     Vitals: BP 112/64 (BP Location: Left Arm, Patient Position: Sitting)   Pulse 69   Temp 97.8 F (36.6 C) (Temporal)   Resp 17   Ht 5\' 1"  (1.549 m)   Wt 89 lb 4.8 oz (40.5 kg)   SpO2 93%   BMI 16.87 kg/m   Body mass index is 16.87 kg/m.  Advanced Directives 09/24/2017 10/07/2016 03/18/2015  Does Patient Have a Medical Advance Directive? Yes Yes Yes  Type of Paramedic of [place];Living will [place] will  Does patient want to make changes to medical advance directive? - No - Patient declined No - Patient declined  Copy of [place] in Chart? No - copy requested No - copy requested No - copy requested  Would patient like information on creating a medical advance directive? - No - Patient declined -    Tobacco Social History   Tobacco Use  Smoking Status Current Some Day Smoker  . Packs/day: 0.25  . Types: Cigarettes  Smokeless Tobacco Never Used     Ready to quit: Yes Counseling given: No   Clinical Intake:  Pre-visit preparation completed: Yes  Pain : No/denies pain     Nutritional Status: BMI <19  Underweight Nutritional Risks: None Diabetes: No  How often do you need to have someone help you when you read instructions, pamphlets, or other written materials from your doctor or pharmacy?: 1 - Never What is the last grade level you completed in school?: associates   Interpreter Needed?: No  Information entered by :: Bradie Sangiovanni,LPN   Past Medical History:  Diagnosis Date  . Breast cancer, right [facility]) 2003   Bilateral mastectomies.   Marland Kitchen COPD (chronic obstructive pulmonary disease) ([place])   . Neurofibromatosis [facility])    Past Surgical  History:  Procedure Laterality Date  . ABDOMINAL HYSTERECTOMY    . BREAST SURGERY     mastectomy  . CHOLECYSTECTOMY    . MASTECTOMY Bilateral    Family History  Problem Relation Age of Onset  . Cancer Mother        breast  . Heart disease Mother   . Thyroid disease Mother   . Hypertension Mother   . Osteoporosis Mother   . Hypertension Sister   . Hypertension Brother   . Cancer Maternal Grandmother        breast , bone  . Bladder Cancer Neg Hx   . Kidney cancer Neg Hx    Social History   Socioeconomic History  . Marital status: Widowed    Spouse name: Not on file  . Number of children: Not on file  . Years of education: 38 years college   . Highest education level: Associate degree: academic program  Occupational History    Employer: [employer]  Social Needs  . Financial resource strain: Not hard at all  . Food insecurity:    Worry: Never true    Inability: Never true  . Transportation needs:    Medical: No    Non-medical: No  Tobacco Use  . Smoking status: Current Some Day Smoker    Packs/day: 0.25    Types: Cigarettes  . Smokeless tobacco: Never Used  Substance and Sexual Activity  .  Alcohol use: Yes    Comment: Occasional  . Drug use: No  . Sexual activity: Never  Lifestyle  . Physical activity:    Days per week: 0 days    Minutes per session: 0 min  . Stress: Not at all  Relationships  . Social connections:    Talks on phone: More than three times a week    Gets together: More than three times a week    Attends religious service: Not on file    Active member of club or organization: Yes    Attends meetings of clubs or organizations: Never    Relationship status: Widowed  Other Topics Concern  . Not on file  Social History Narrative  . Not on file    Outpatient Encounter Medications as of 09/24/2017  Medication Sig  . albuterol (PROVENTIL HFA;VENTOLIN HFA) 108 (90 Base) MCG/ACT inhaler Inhale 2 puffs into the lungs every 6 (six) hours as needed for  wheezing or shortness of breath.  Marland Kitchen aspirin EC 81 MG tablet Take by mouth.  . calcium carbonate (OS-CAL - DOSED IN MG OF ELEMENTAL CALCIUM) 1250 (500 Ca) MG tablet Take 1 tablet by mouth.  . tiotropium (SPIRIVA HANDIHALER) 18 MCG inhalation capsule Place 1 capsule (18 mcg total) into inhaler and inhale daily.  . vitamin B-12 (CYANOCOBALAMIN) 100 MCG tablet Take 100 mcg by mouth daily.  . Vitamin D, Ergocalciferol, (DRISDOL) 50000 units CAPS capsule Take by mouth.  . vitamin E 100 UNIT capsule Take by mouth daily.   No facility-administered encounter medications on file as of 09/24/2017.     Activities of Daily Living In your present state of health, do you have any difficulty performing the following activities: 09/24/2017 09/24/2017  Hearing? N N  Vision? N N  Difficulty concentrating or making decisions? N N  Walking or climbing stairs? N N  Dressing or bathing? N N  Doing errands, shopping? N N  Preparing Food and eating ? N N  Using the Toilet? N N  In the past six months, have you accidently leaked urine? N N  Do you have problems with loss of bowel control? N N  Managing your Medications? N N  Managing your Finances? N N  Housekeeping or managing your Housekeeping? N N  Some recent data might be hidden    Patient Care Team: Valerie Roys, DO as PCP - General (Family Medicine)    Assessment:   This is a routine wellness examination for [facility].  Exercise Activities and Dietary recommendations Current Exercise Habits: The patient does not participate in regular exercise at present, Exercise limited by: None identified  Goals    . Quit Smoking     Smoking cessation discussed        Fall Risk Fall Risk  09/24/2017 09/24/2017 08/10/2016 03/18/2015  Falls in the past year? No No Yes No  Number falls in past yr: - - 2 or more -  Injury with Fall? - - No -   Is the patient's home free of loose throw rugs in walkways, pet beds, electrical cords, etc?   no      Grab bars in the  bathroom? no      Handrails on the stairs?   yes      Adequate lighting?   yes  Timed Get Up and Go performed: Completed in 8 seconds with no use of assistive devices, steady gait. No intervention needed at this time.   Depression Screen PHQ 2/9 Scores 09/24/2017  09/24/2017 08/10/2016 03/18/2015  PHQ - 2 Score 0 0 0 0  PHQ- 9 Score - - 0 -     Cognitive Function     6CIT Screen 09/24/2017 08/10/2016  What Year? 0 points 0 points  What month? 0 points 0 points  What time? 0 points 0 points  Count back from 20 0 points 0 points  Months in reverse 0 points 0 points  Repeat phrase 0 points 4 points  Total Score 0 4    Immunization History  Administered Date(s) Administered  . Influenza, High Dose Seasonal PF 10/07/2015, 09/24/2017  . Influenza-Unspecified 11/17/2014, 08/24/2016  . Pneumococcal Conjugate-13 09/10/2014  . Pneumococcal Polysaccharide-23 11/28/2012  . Tdap 11/30/2009    Qualifies for Shingles Vaccine? Yes, discussed shingrix vaccine   Screening Tests Health Maintenance  Topic Date Due  . INFLUENZA VACCINE  08/16/2017  . COLONOSCOPY  02/08/2018 (Originally 11/06/2016)  . TETANUS/TDAP  12/01/2019  . DEXA SCAN  Completed  . Hepatitis C Screening  Completed  . PNA vac Low Risk Adult  Completed    Cancer Screenings: Lung: Low Dose CT Chest recommended if Age 62-80 years, 30 pack-year currently smoking OR have quit w/in 15years. Patient does not qualify. Breast:  Up to date on Mammogram? No, not indicated-  Had double mastectomy about 15 years ago  Up to date of Bone Density/Dexa? Yes 09/07/2016 Colorectal: due now - declined   Additional Screenings:  Hepatitis C Screening: completed 02/08/2017    Plan:    I have personally reviewed and addressed the Medicare Annual Wellness questionnaire and have noted the following in the patient's chart:  A. Medical and social history B. Use of alcohol, tobacco or illicit drugs  C. Current medications and  supplements D. Functional ability and status E.  Nutritional status F.  Physical activity G. Advance directives H. List of other physicians I.  Hospitalizations, surgeries, and ER visits in previous 12 months J.  [place] such as hearing and vision if needed, cognitive and depression L. Referrals and appointments   In addition, I have reviewed and discussed with patient certain preventive protocols, quality metrics, and best practice recommendations. A written personalized care plan for preventive services as well as general preventive health recommendations were provided to patient.   Signed,  Tyler Aas, LPN Nurse Health Advisor   Nurse Notes:none

## 2017-09-24 NOTE — Assessment & Plan Note (Signed)
Not interested in quitting. Will call if she changes her mind. Call with any concerns.

## 2017-09-24 NOTE — Assessment & Plan Note (Signed)
Under good control on current regimen. Continue current regimen. Continue to monitor. Call with any concerns. Refills given.   

## 2017-09-24 NOTE — Patient Instructions (Addendum)
Health Maintenance for Postmenopausal Women Menopause is a normal process in which your reproductive ability comes to an end. This process happens gradually over a span of months to years, usually between the ages of 22 and 9. Menopause is complete when you have missed 12 consecutive menstrual periods. It is important to talk with your health care provider about some of the most common conditions that affect postmenopausal women, such as heart disease, cancer, and bone loss (osteoporosis). Adopting a healthy lifestyle and getting preventive care can help to promote your health and wellness. Those actions can also lower your chances of developing some of these common conditions. What should I know about menopause? During menopause, you may experience a number of symptoms, such as:  Moderate-to-severe hot flashes.  Night sweats.  Decrease in sex drive.  Mood swings.  Headaches.  Tiredness.  Irritability.  Memory problems.  Insomnia.  Choosing to treat or not to treat menopausal changes is an individual decision that you make with your health care provider. What should I know about hormone replacement therapy and supplements? Hormone therapy products are effective for treating symptoms that are associated with menopause, such as hot flashes and night sweats. Hormone replacement carries certain risks, especially as you become older. If you are thinking about using estrogen or estrogen with progestin treatments, discuss the benefits and risks with your health care provider. What should I know about heart disease and stroke? Heart disease, heart attack, and stroke become more likely as you age. This may be due, in part, to the hormonal changes that your body experiences during menopause. These can affect how your body processes dietary fats, triglycerides, and cholesterol. Heart attack and stroke are both medical emergencies. There are many things that you can do to help prevent heart disease  and stroke:  Have your blood pressure checked at least every 1-2 years. High blood pressure causes heart disease and increases the risk of stroke.  If you are 53-22 years old, ask your health care provider if you should take aspirin to prevent a heart attack or a stroke.  Do not use any tobacco products, including cigarettes, chewing tobacco, or electronic cigarettes. If you need help quitting, ask your health care provider.  It is important to eat a healthy diet and maintain a healthy weight. ? Be sure to include plenty of vegetables, fruits, low-fat dairy products, and lean protein. ? Avoid eating foods that are high in solid fats, added sugars, or salt (sodium).  Get regular exercise. This is one of the most important things that you can do for your health. ? Try to exercise for at least 150 minutes each week. The type of exercise that you do should increase your heart rate and make you sweat. This is known as moderate-intensity exercise. ? Try to do strengthening exercises at least twice each week. Do these in addition to the moderate-intensity exercise.  Know your numbers.Ask your health care provider to check your cholesterol and your blood glucose. Continue to have your blood tested as directed by your health care provider.  What should I know about cancer screening? There are several types of cancer. Take the following steps to reduce your risk and to catch any cancer development as early as possible. Breast Cancer  Practice breast self-awareness. ? This means understanding how your breasts normally appear and feel. ? It also means doing regular breast self-exams. Let your health care provider know about any changes, no matter how small.  If you are 40  or older, have a clinician do a breast exam (clinical breast exam or CBE) every year. Depending on your age, family history, and medical history, it may be recommended that you also have a yearly breast X-ray (mammogram).  If you  have a family history of breast cancer, talk with your health care provider about genetic screening.  If you are at high risk for breast cancer, talk with your health care provider about having an MRI and a mammogram every year.  Breast cancer (BRCA) gene test is recommended for women who have family members with BRCA-related cancers. Results of the assessment will determine the need for genetic counseling and BRCA1 and for BRCA2 testing. BRCA-related cancers include these types: ? Breast. This occurs in males or females. ? Ovarian. ? Tubal. This may also be called fallopian tube cancer. ? Cancer of the abdominal or pelvic lining (peritoneal cancer). ? Prostate. ? Pancreatic.  Cervical, Uterine, and Ovarian Cancer Your health care provider may recommend that you be screened regularly for cancer of the pelvic organs. These include your ovaries, uterus, and vagina. This screening involves a pelvic exam, which includes checking for microscopic changes to the surface of your cervix (Pap test).  For women ages 21-65, health care providers may recommend a pelvic exam and a Pap test every three years. For women ages 79-65, they may recommend the Pap test and pelvic exam, combined with testing for human papilloma virus (HPV), every five years. Some types of HPV increase your risk of cervical cancer. Testing for HPV may also be done on women of any age who have unclear Pap test results.  Other health care providers may not recommend any screening for nonpregnant women who are considered low risk for pelvic cancer and have no symptoms. Ask your health care provider if a screening pelvic exam is right for you.  If you have had past treatment for cervical cancer or a condition that could lead to cancer, you need Pap tests and screening for cancer for at least 20 years after your treatment. If Pap tests have been discontinued for you, your risk factors (such as having a new sexual partner) need to be  reassessed to determine if you should start having screenings again. Some women have medical problems that increase the chance of getting cervical cancer. In these cases, your health care provider may recommend that you have screening and Pap tests more often.  If you have a family history of uterine cancer or ovarian cancer, talk with your health care provider about genetic screening.  If you have vaginal bleeding after reaching menopause, tell your health care provider.  There are currently no reliable tests available to screen for ovarian cancer.  Lung Cancer Lung cancer screening is recommended for adults 69-62 years old who are at high risk for lung cancer because of a history of smoking. A yearly low-dose CT scan of the lungs is recommended if you:  Currently smoke.  Have a history of at least 30 pack-years of smoking and you currently smoke or have quit within the past 15 years. A pack-year is smoking an average of one pack of cigarettes per day for one year.  Yearly screening should:  Continue until it has been 15 years since you quit.  Stop if you develop a health problem that would prevent you from having lung cancer treatment.  Colorectal Cancer  This type of cancer can be detected and can often be prevented.  Routine colorectal cancer screening usually begins at  age 42 and continues through age 45.  If you have risk factors for colon cancer, your health care provider may recommend that you be screened at an earlier age.  If you have a family history of colorectal cancer, talk with your health care provider about genetic screening.  Your health care provider may also recommend using home test kits to check for hidden blood in your stool.  A small camera at the end of a tube can be used to examine your colon directly (sigmoidoscopy or colonoscopy). This is done to check for the earliest forms of colorectal cancer.  Direct examination of the colon should be repeated every  5-10 years until age 71. However, if early forms of precancerous polyps or small growths are found or if you have a family history or genetic risk for colorectal cancer, you may need to be screened more often.  Skin Cancer  Check your skin from head to toe regularly.  Monitor any moles. Be sure to tell your health care provider: ? About any new moles or changes in moles, especially if there is a change in a mole's shape or color. ? If you have a mole that is larger than the size of a pencil eraser.  If any of your family members has a history of skin cancer, especially at a young age, talk with your health care provider about genetic screening.  Always use sunscreen. Apply sunscreen liberally and repeatedly throughout the day.  Whenever you are outside, protect yourself by wearing long sleeves, pants, a wide-brimmed hat, and sunglasses.  What should I know about osteoporosis? Osteoporosis is a condition in which bone destruction happens more quickly than new bone creation. After menopause, you may be at an increased risk for osteoporosis. To help prevent osteoporosis or the bone fractures that can happen because of osteoporosis, the following is recommended:  If you are 46-71 years old, get at least 1,000 mg of calcium and at least 600 mg of vitamin D per day.  If you are older than age 55 but younger than age 65, get at least 1,200 mg of calcium and at least 600 mg of vitamin D per day.  If you are older than age 54, get at least 1,200 mg of calcium and at least 800 mg of vitamin D per day.  Smoking and excessive alcohol intake increase the risk of osteoporosis. Eat foods that are rich in calcium and vitamin D, and do weight-bearing exercises several times each week as directed by your health care provider. What should I know about how menopause affects my mental health? Depression may occur at any age, but it is more common as you become older. Common symptoms of depression  include:  Low or sad mood.  Changes in sleep patterns.  Changes in appetite or eating patterns.  Feeling an overall lack of motivation or enjoyment of activities that you previously enjoyed.  Frequent crying spells.  Talk with your health care provider if you think that you are experiencing depression. What should I know about immunizations? It is important that you get and maintain your immunizations. These include:  Tetanus, diphtheria, and pertussis (Tdap) booster vaccine.  Influenza every year before the flu season begins.  Pneumonia vaccine.  Shingles vaccine.  Your health care provider may also recommend other immunizations. This information is not intended to replace advice given to you by your health care provider. Make sure you discuss any questions you have with your health care provider. Document Released: 02/24/2005  Document Revised: 07/23/2015 Document Reviewed: 10/06/2014 [employer] Interactive Patient Education  2018 [employer]  Health Maintenance for Postmenopausal Women Menopause is a normal process in which your reproductive ability comes to an end. This process happens gradually over a span of months to years, usually between the ages of 48 and 55. Menopause is complete when you have missed 12 consecutive menstrual periods. It is important to talk with your health care provider about some of the most common conditions that affect postmenopausal women, such as heart disease, cancer, and bone loss (osteoporosis). Adopting a healthy lifestyle and getting preventive care can help to promote your health and wellness. Those actions can also lower your chances of developing some of these common conditions. What should I know about menopause? During menopause, you may experience a number of symptoms, such as:  Moderate-to-severe hot flashes.  Night sweats.  Decrease in sex drive.  Mood swings.  Headaches.  Tiredness.  Irritability.  Memory  problems.  Insomnia.  Choosing to treat or not to treat menopausal changes is an individual decision that you make with your health care provider. What should I know about hormone replacement therapy and supplements? Hormone therapy products are effective for treating symptoms that are associated with menopause, such as hot flashes and night sweats. Hormone replacement carries certain risks, especially as you become older. If you are thinking about using estrogen or estrogen with progestin treatments, discuss the benefits and risks with your health care provider. What should I know about heart disease and stroke? Heart disease, heart attack, and stroke become more likely as you age. This may be due, in part, to the hormonal changes that your body experiences during menopause. These can affect how your body processes dietary fats, triglycerides, and cholesterol. Heart attack and stroke are both medical emergencies. There are many things that you can do to help prevent heart disease and stroke:  Have your blood pressure checked at least every 1-2 years. High blood pressure causes heart disease and increases the risk of stroke.  If you are 55-79 years old, ask your health care provider if you should take aspirin to prevent a heart attack or a stroke.  Do not use any tobacco products, including cigarettes, chewing tobacco, or electronic cigarettes. If you need help quitting, ask your health care provider.  It is important to eat a healthy diet and maintain a healthy weight. ? Be sure to include plenty of vegetables, fruits, low-fat dairy products, and lean protein. ? Avoid eating foods that are high in solid fats, added sugars, or salt (sodium).  Get regular exercise. This is one of the most important things that you can do for your health. ? Try to exercise for at least 150 minutes each week. The type of exercise that you do should increase your heart rate and make you sweat. This is known as  moderate-intensity exercise. ? Try to do strengthening exercises at least twice each week. Do these in addition to the moderate-intensity exercise.  Know your numbers.Ask your health care provider to check your cholesterol and your blood glucose. Continue to have your blood tested as directed by your health care provider.  What should I know about cancer screening? There are several types of cancer. Take the following steps to reduce your risk and to catch any cancer development as early as possible. Breast Cancer  Practice breast self-awareness. ? This means understanding how your breasts normally appear and feel. ? It also means doing regular breast self-exams. Let your   health care provider know about any changes, no matter how small.  If you are 40 or older, have a clinician do a breast exam (clinical breast exam or CBE) every year. Depending on your age, family history, and medical history, it may be recommended that you also have a yearly breast X-ray (mammogram).  If you have a family history of breast cancer, talk with your health care provider about genetic screening.  If you are at high risk for breast cancer, talk with your health care provider about having an MRI and a mammogram every year.  Breast cancer (BRCA) gene test is recommended for women who have family members with BRCA-related cancers. Results of the assessment will determine the need for genetic counseling and BRCA1 and for BRCA2 testing. BRCA-related cancers include these types: ? Breast. This occurs in males or females. ? Ovarian. ? Tubal. This may also be called fallopian tube cancer. ? Cancer of the abdominal or pelvic lining (peritoneal cancer). ? Prostate. ? Pancreatic.  Cervical, Uterine, and Ovarian Cancer Your health care provider may recommend that you be screened regularly for cancer of the pelvic organs. These include your ovaries, uterus, and vagina. This screening involves a pelvic exam, which  includes checking for microscopic changes to the surface of your cervix (Pap test).  For women ages 21-65, health care providers may recommend a pelvic exam and a Pap test every three years. For women ages 30-65, they may recommend the Pap test and pelvic exam, combined with testing for human papilloma virus (HPV), every five years. Some types of HPV increase your risk of cervical cancer. Testing for HPV may also be done on women of any age who have unclear Pap test results.  Other health care providers may not recommend any screening for nonpregnant women who are considered low risk for pelvic cancer and have no symptoms. Ask your health care provider if a screening pelvic exam is right for you.  If you have had past treatment for cervical cancer or a condition that could lead to cancer, you need Pap tests and screening for cancer for at least 20 years after your treatment. If Pap tests have been discontinued for you, your risk factors (such as having a new sexual partner) need to be reassessed to determine if you should start having screenings again. Some women have medical problems that increase the chance of getting cervical cancer. In these cases, your health care provider may recommend that you have screening and Pap tests more often.  If you have a family history of uterine cancer or ovarian cancer, talk with your health care provider about genetic screening.  If you have vaginal bleeding after reaching menopause, tell your health care provider.  There are currently no reliable tests available to screen for ovarian cancer.  Lung Cancer Lung cancer screening is recommended for adults 55-80 years old who are at high risk for lung cancer because of a history of smoking. A yearly low-dose CT scan of the lungs is recommended if you:  Currently smoke.  Have a history of at least 30 pack-years of smoking and you currently smoke or have quit within the past 15 years. A pack-year is smoking an  average of one pack of cigarettes per day for one year.  Yearly screening should:  Continue until it has been 15 years since you quit.  Stop if you develop a health problem that would prevent you from having lung cancer treatment.  Colorectal Cancer  This type of cancer   can be detected and can often be prevented.  Routine colorectal cancer screening usually begins at age 93 and continues through age 26.  If you have risk factors for colon cancer, your health care provider may recommend that you be screened at an earlier age.  If you have a family history of colorectal cancer, talk with your health care provider about genetic screening.  Your health care provider may also recommend using home test kits to check for hidden blood in your stool.  A small camera at the end of a tube can be used to examine your colon directly (sigmoidoscopy or colonoscopy). This is done to check for the earliest forms of colorectal cancer.  Direct examination of the colon should be repeated every 5-10 years until age 38. However, if early forms of precancerous polyps or small growths are found or if you have a family history or genetic risk for colorectal cancer, you may need to be screened more often.  Skin Cancer  Check your skin from head to toe regularly.  Monitor any moles. Be sure to tell your health care provider: ? About any new moles or changes in moles, especially if there is a change in a mole's shape or color. ? If you have a mole that is larger than the size of a pencil eraser.  If any of your family members has a history of skin cancer, especially at a young age, talk with your health care provider about genetic screening.  Always use sunscreen. Apply sunscreen liberally and repeatedly throughout the day.  Whenever you are outside, protect yourself by wearing long sleeves, pants, a wide-brimmed hat, and sunglasses.  What should I know about osteoporosis? Osteoporosis is a condition in  which bone destruction happens more quickly than new bone creation. After menopause, you may be at an increased risk for osteoporosis. To help prevent osteoporosis or the bone fractures that can happen because of osteoporosis, the following is recommended:  If you are 11-31 years old, get at least 1,000 mg of calcium and at least 600 mg of vitamin D per day.  If you are older than age 66 but younger than age 45, get at least 1,200 mg of calcium and at least 600 mg of vitamin D per day.  If you are older than age 10, get at least 1,200 mg of calcium and at least 800 mg of vitamin D per day.  Smoking and excessive alcohol intake increase the risk of osteoporosis. Eat foods that are rich in calcium and vitamin D, and do weight-bearing exercises several times each week as directed by your health care provider. What should I know about how menopause affects my mental health? Depression may occur at any age, but it is more common as you become older. Common symptoms of depression include:  Low or sad mood.  Changes in sleep patterns.  Changes in appetite or eating patterns.  Feeling an overall lack of motivation or enjoyment of activities that you previously enjoyed.  Frequent crying spells.  Talk with your health care provider if you think that you are experiencing depression. What should I know about immunizations? It is important that you get and maintain your immunizations. These include:  Tetanus, diphtheria, and pertussis (Tdap) booster vaccine.  Influenza every year before the flu season begins.  Pneumonia vaccine.  Shingles vaccine.  Your health care provider may also recommend other immunizations. This information is not intended to replace advice given to you by your health care provider. Make  sure you discuss any questions you have with your health care provider. Document Released: 02/24/2005 Document Revised: 07/23/2015 Document Reviewed: 10/06/2014 [employer] Interactive  Patient Education  2018 [employer].

## 2017-09-24 NOTE — Patient Instructions (Addendum)
Brenda Rocha , Thank you for taking time to come for your Medicare Wellness Visit. I appreciate your ongoing commitment to your health goals. Please review the following plan we discussed and let me know if I can assist you in the future.   Screening recommendations/referrals: Colonoscopy: due now- declined Mammogram: not eligible  Bone Density: completed 09/07/2016 Recommended yearly ophthalmology/optometry visit for glaucoma screening and checkup Recommended yearly dental visit for hygiene and checkup  Vaccinations: Influenza vaccine: done today  Pneumococcal vaccine: up to date Tdap vaccine: up to date Shingles vaccine: shingrix eligible, check with your insurance company for coverage     Advanced directives: Please bring a copy of your health care power of attorney and living will to the office at your convenience.  Conditions/risks identified: Smoking cessation discussed   Next appointment: Follow up in one year for your annual wellness exam.    Preventive Care 73 Years and Older, Female Preventive care refers to lifestyle choices and visits with your health care provider that can promote health and wellness. What does preventive care include?  A yearly physical exam. This is also called an annual well check.  Dental exams once or twice a year.  Routine eye exams. Ask your health care provider how often you should have your eyes checked.  Personal lifestyle choices, including:  Daily care of your teeth and gums.  Regular physical activity.  Eating a healthy diet.  Avoiding tobacco and drug use.  Limiting alcohol use.  Practicing safe sex.  Taking low-dose aspirin every day.  Taking vitamin and mineral supplements as recommended by your health care provider. What happens during an annual well check? The services and screenings done by your health care provider during your annual well check will depend on your age, overall health, lifestyle risk factors, and  family history of disease. Counseling  Your health care provider may ask you questions about your:  Alcohol use.  Tobacco use.  Drug use.  Emotional well-being.  Home and relationship well-being.  Sexual activity.  Eating habits.  History of falls.  Memory and ability to understand (cognition).  Work and work Statistician.  Reproductive health. Screening  You may have the following tests or measurements:  Height, weight, and BMI.  Blood pressure.  Lipid and cholesterol levels. These may be checked every 5 years, or more frequently if you are over 73 years old.  Skin check.  Lung cancer screening. You may have this screening every year starting at age 73 if you have a 30-pack-year history of smoking and currently smoke or have quit within the past 15 years.  Fecal occult blood test (FOBT) of the stool. You may have this test every year starting at age 73.  Flexible sigmoidoscopy or colonoscopy. You may have a sigmoidoscopy every 5 years or a colonoscopy every 10 years starting at age 73.  Hepatitis C blood test.  Hepatitis B blood test.  Sexually transmitted disease (STD) testing.  Diabetes screening. This is done by checking your blood sugar (glucose) after you have not eaten for a while (fasting). You may have this done every 1-3 years.  Bone density scan. This is done to screen for osteoporosis. You may have this done starting at age 73.  Mammogram. This may be done every 1-2 years. Talk to your health care provider about how often you should have regular mammograms. Talk with your health care provider about your test results, treatment options, and if necessary, the need for more tests. Vaccines  Your  health care provider may recommend certain vaccines, such as:  Influenza vaccine. This is recommended every year.  Tetanus, diphtheria, and acellular pertussis (Tdap, Td) vaccine. You may need a Td booster every 10 years.  Zoster vaccine. You may need this  after age 73.  Pneumococcal 13-valent conjugate (PCV13) vaccine. One dose is recommended after age 73.  Pneumococcal polysaccharide (PPSV23) vaccine. One dose is recommended after age 73. Talk to your health care provider about which screenings and vaccines you need and how often you need them. This information is not intended to replace advice given to you by your health care provider. Make sure you discuss any questions you have with your health care provider. Document Released: 01/29/2015 Document Revised: 09/22/2015 Document Reviewed: 11/03/2014 Elsevier Interactive Patient Education  2017 [place] Prevention in the Home Falls can cause injuries. They can happen to people of all ages. There are many things you can do to make your home safe and to help prevent falls. What can I do on the outside of my home?  Regularly fix the edges of walkways and driveways and fix any cracks.  Remove anything that might make you trip as you walk through a door, such as a raised step or threshold.  Trim any bushes or trees on the path to your home.  Use bright outdoor lighting.  Clear any walking paths of anything that might make someone trip, such as rocks or tools.  Regularly check to see if handrails are loose or broken. Make sure that both sides of any steps have handrails.  Any raised decks and porches should have guardrails on the edges.  Have any leaves, snow, or ice cleared regularly.  Use sand or salt on walking paths during winter.  Clean up any spills in your garage right away. This includes oil or grease spills. What can I do in the bathroom?  Use night lights.  Install grab bars by the toilet and in the tub and shower. Do not use towel bars as grab bars.  Use non-skid mats or decals in the tub or shower.  If you need to sit down in the shower, use a plastic, non-slip stool.  Keep the floor dry. Clean up any water that spills on the floor as soon as it  happens.  Remove soap buildup in the tub or shower regularly.  Attach bath mats securely with double-sided non-slip rug tape.  Do not have throw rugs and other things on the floor that can make you trip. What can I do in the bedroom?  Use night lights.  Make sure that you have a light by your bed that is easy to reach.  Do not use any sheets or blankets that are too big for your bed. They should not hang down onto the floor.  Have a firm chair that has side arms. You can use this for support while you get dressed.  Do not have throw rugs and other things on the floor that can make you trip. What can I do in the kitchen?  Clean up any spills right away.  Avoid walking on wet floors.  Keep items that you use a lot in easy-to-reach places.  If you need to reach something above you, use a strong step stool that has a grab bar.  Keep electrical cords out of the way.  Do not use floor polish or wax that makes floors slippery. If you must use wax, use non-skid floor wax.  Do not  have throw rugs and other things on the floor that can make you trip. What can I do with my stairs?  Do not leave any items on the stairs.  Make sure that there are handrails on both sides of the stairs and use them. Fix handrails that are broken or loose. Make sure that handrails are as long as the stairways.  Check any carpeting to make sure that it is firmly attached to the stairs. Fix any carpet that is loose or worn.  Avoid having throw rugs at the top or bottom of the stairs. If you do have throw rugs, attach them to the floor with carpet tape.  Make sure that you have a light switch at the top of the stairs and the bottom of the stairs. If you do not have them, ask someone to add them for you. What else can I do to help prevent falls?  Wear shoes that:  Do not have high heels.  Have rubber bottoms.  Are comfortable and fit you well.  Are closed at the toe. Do not wear sandals.  If you  use a stepladder:  Make sure that it is fully opened. Do not climb a closed stepladder.  Make sure that both sides of the stepladder are locked into place.  Ask someone to hold it for you, if possible.  Clearly mark and make sure that you can see:  Any grab bars or handrails.  First and last steps.  Where the edge of each step is.  Use tools that help you move around (mobility aids) if they are needed. These include:  Canes.  Walkers.  Scooters.  Crutches.  Turn on the lights when you go into a dark area. Replace any light bulbs as soon as they burn out.  Set up your furniture so you have a clear path. Avoid moving your furniture around.  If any of your floors are uneven, fix them.  If there are any pets around you, be aware of where they are.  Review your medicines with your doctor. Some medicines can make you feel dizzy. This can increase your chance of falling. Ask your doctor what other things that you can do to help prevent falls. This information is not intended to replace advice given to you by your health care provider. Make sure you discuss any questions you have with your health care provider. Document Released: 10/29/2008 Document Revised: 06/10/2015 Document Reviewed: 02/06/2014 Elsevier Interactive Patient Education  2017 [employer].   Steps to Quit Smoking Smoking tobacco can be bad for your health. It can also affect almost every organ in your body. Smoking puts you and people around you at risk for many serious long-lasting (chronic) diseases. Quitting smoking is hard, but it is one of the best things that you can do for your health. It is never too late to quit. What are the benefits of quitting smoking? When you quit smoking, you lower your risk for getting serious diseases and conditions. They can include:  Lung cancer or lung disease.  Heart disease.  Stroke.  Heart attack.  Not being able to have children (infertility).  Weak bones  (osteoporosis) and broken bones (fractures).  If you have coughing, wheezing, and shortness of breath, those symptoms may get better when you quit. You may also get sick less often. If you are pregnant, quitting smoking can help to lower your chances of having a baby of low birth weight. What can I do to help me quit  smoking? Talk with your doctor about what can help you quit smoking. Some things you can do (strategies) include:  Quitting smoking totally, instead of slowly cutting back how much you smoke over a period of time.  Going to in-person counseling. You are more likely to quit if you go to many counseling sessions.  Using resources and support systems, such as: ? Database administrator with a Social worker. ? Phone quitlines. ? Careers information officer. ? Support groups or group counseling. ? Text messaging programs. ? Mobile phone apps or applications.  Taking medicines. Some of these medicines may have nicotine in them. If you are pregnant or breastfeeding, do not take any medicines to quit smoking unless your doctor says it is okay. Talk with your doctor about counseling or other things that can help you.  Talk with your doctor about using more than one strategy at the same time, such as taking medicines while you are also going to in-person counseling. This can help make quitting easier. What things can I do to make it easier to quit? Quitting smoking might feel very hard at first, but there is a lot that you can do to make it easier. Take these steps:  Talk to your family and friends. Ask them to support and encourage you.  Call phone quitlines, reach out to support groups, or work with a Social worker.  Ask people who smoke to not smoke around you.  Avoid places that make you want (trigger) to smoke, such as: ? Bars. ? Parties. ? Smoke-break areas at work.  Spend time with people who do not smoke.  Lower the stress in your life. Stress can make you want to smoke. Try these things  to help your stress: ? Getting regular exercise. ? Deep-breathing exercises. ? Yoga. ? Meditating. ? Doing a body scan. To do this, close your eyes, focus on one area of your body at a time from head to toe, and notice which parts of your body are tense. Try to relax the muscles in those areas.  Download or buy apps on your mobile phone or tablet that can help you stick to your quit plan. There are many free apps, such as QuitGuide from the [employer] Office manager for Disease Control and Prevention). You can find more support from smokefree.gov and other websites.  This information is not intended to replace advice given to you by your health care provider. Make sure you discuss any questions you have with your health care provider. Document Released: 10/29/2008 Document Revised: 08/31/2015 Document Reviewed: 05/19/2014 Elsevier Interactive Patient Education  2018 [employer].

## 2017-09-24 NOTE — Progress Notes (Signed)
BP 112/64 (BP Location: Left Arm, Patient Position: Sitting, Cuff Size: Small)   Pulse 69   Temp 97.8 F (36.6 C)   Resp 17   Ht 5\' 1"  (1.549 m)   Wt 89 lb 4.8 oz (40.5 kg)   SpO2 93%   BMI 16.87 kg/m    Subjective:    Patient ID: Brenda Rocha, female    DOB: 1946-01-09, 72 y.o.   MRN: [id number]  HPI: Brenda Rocha is a 72 y.o. female presenting on 09/24/2017 for comprehensive medical examination. Current medical complaints include:  Has been having issues with her urine. States that she doesn't want to follow up with her urologist because she is not happy with them and they are not giving her answers. She states that she is not fully emptying her bladder.   Follows with endocrinology for osteoporosis. Does not want to do reclast. Sees them in October.   Not on medicine for her cholesterol. Not watching her diet.   Breathing is doing well. No concerns.   No other concerns or complaints at this time.   Depression Screen done today and results listed below:  Depression screen [facility] 2/9 09/24/2017 09/24/2017 08/10/2016 03/18/2015  Decreased Interest 0 0 0 0  Down, Depressed, Hopeless 0 0 0 0  PHQ - 2 Score 0 0 0 0  Altered sleeping - - 0 -  Tired, decreased energy - - 0 -  Change in appetite - - 0 -  Feeling bad or failure about yourself  - - 0 -  Trouble concentrating - - 0 -  Moving slowly or fidgety/restless - - 0 -  Suicidal thoughts - - 0 -  PHQ-9 Score - - 0 -    Past Medical History:  Past Medical History:  Diagnosis Date  . Breast cancer, right [facility]) 2003   Bilateral mastectomies.   Marland Kitchen COPD (chronic obstructive pulmonary disease) ([place])   . Neurofibromatosis [facility])     Surgical History:  Past Surgical History:  Procedure Laterality Date  . ABDOMINAL HYSTERECTOMY    . BREAST SURGERY     mastectomy  . CHOLECYSTECTOMY    . MASTECTOMY Bilateral     Medications:  Current Outpatient Medications on File Prior to Visit  Medication Sig  . aspirin EC 81 MG  tablet Take by mouth.  . calcium carbonate (OS-CAL - DOSED IN MG OF ELEMENTAL CALCIUM) 1250 (500 Ca) MG tablet Take 1 tablet by mouth.  . Vitamin D, Ergocalciferol, (DRISDOL) 50000 units CAPS capsule Take by mouth.   No current facility-administered medications on file prior to visit.     Allergies:  No Known Allergies  Social History:  Social History   Socioeconomic History  . Marital status: Widowed    Spouse name: Not on file  . Number of children: Not on file  . Years of education: 67 years college   . Highest education level: Associate degree: academic program  Occupational History    Employer: [employer]  Social Needs  . Financial resource strain: Not hard at all  . Food insecurity:    Worry: Never true    Inability: Never true  . Transportation needs:    Medical: No    Non-medical: No  Tobacco Use  . Smoking status: Current Some Day Smoker    Packs/day: 0.25    Types: Cigarettes  . Smokeless tobacco: Never Used  Substance and Sexual Activity  . Alcohol use: Yes    Comment: Occasional  . Drug use:  No  . Sexual activity: Never  Lifestyle  . Physical activity:    Days per week: 0 days    Minutes per session: 0 min  . Stress: Not at all  Relationships  . Social connections:    Talks on phone: More than three times a week    Gets together: More than three times a week    Attends religious service: Not on file    Active member of club or organization: Yes    Attends meetings of clubs or organizations: Never    Relationship status: Widowed  . Intimate partner violence:    Fear of current or ex partner: No    Emotionally abused: No    Physically abused: No    Forced sexual activity: No  Other Topics Concern  . Not on file  Social History Narrative  . Not on file   Social History   Tobacco Use  Smoking Status Current Some Day Smoker  . Packs/day: 0.25  . Types: Cigarettes  Smokeless Tobacco Never Used   Social History   Substance and Sexual Activity    Alcohol Use Yes   Comment: Occasional    Family History:  Family History  Problem Relation Age of Onset  . Cancer Mother        breast  . Heart disease Mother   . Thyroid disease Mother   . Hypertension Mother   . Osteoporosis Mother   . Hypertension Sister   . Hypertension Brother   . Cancer Brother        bladder cancer  . CVA Brother   . Cancer Maternal Grandmother        breast , bone  . Bladder Cancer Neg Hx   . Kidney cancer Neg Hx     Past medical history, surgical history, medications, allergies, family history and social history reviewed with patient today and changes made to appropriate areas of the chart.   Review of Systems  Constitutional: Negative.   HENT: Negative.   Eyes: Negative.   Respiratory: Negative.   Cardiovascular: Negative.   Gastrointestinal: Positive for constipation. Negative for abdominal pain, blood in stool, diarrhea, heartburn, melena, nausea and vomiting.  Genitourinary: Negative for dysuria, flank pain, frequency, hematuria and urgency.  Musculoskeletal: Negative.   Skin: Negative.   Neurological: Negative.   Endo/Heme/Allergies: Negative.   Psychiatric/Behavioral: Negative.     All other ROS negative except what is listed above and in the HPI.      Objective:    BP 112/64 (BP Location: Left Arm, Patient Position: Sitting, Cuff Size: Small)   Pulse 69   Temp 97.8 F (36.6 C)   Resp 17   Ht 5\' 1"  (1.549 m)   Wt 89 lb 4.8 oz (40.5 kg)   SpO2 93%   BMI 16.87 kg/m   Wt Readings from Last 3 Encounters:  09/24/17 89 lb 4.8 oz (40.5 kg)  09/04/17 88 lb 1.6 oz (40 kg)  03/02/17 94 lb (42.6 kg)    Physical Exam  Constitutional: She is oriented to person, place, and time. She appears well-developed and well-nourished. No distress.  HENT:  Head: Normocephalic and atraumatic.  Right Ear: Hearing, tympanic membrane, external ear and ear canal normal.  Left Ear: Hearing, tympanic membrane, external ear and ear canal normal.   Nose: Nose normal.  Mouth/Throat: Uvula is midline, oropharynx is clear and moist and mucous membranes are normal. No oropharyngeal exudate.  Eyes: Pupils are equal, round, and reactive to Rocha. Conjunctivae, EOM  and lids are normal. Right eye exhibits no discharge. Left eye exhibits no discharge. No scleral icterus.  Neck: Normal range of motion. Neck supple. No JVD present. No tracheal deviation present. No thyromegaly present.  Cardiovascular: Normal rate, regular rhythm, normal heart sounds and intact distal pulses. Exam reveals no gallop and no friction rub.  No murmur heard. Pulmonary/Chest: Effort normal and breath sounds normal. No stridor. No respiratory distress. She has no wheezes. She has no rales. She exhibits no tenderness.  Abdominal: Soft. Bowel sounds are normal. She exhibits no distension and no mass. There is no tenderness. There is no rebound and no guarding. No hernia.  Genitourinary:  Genitourinary Comments: Pelvic and breast exams deferred with shared decision making  Musculoskeletal: Normal range of motion. She exhibits no edema, tenderness or deformity.  Lymphadenopathy:    She has no cervical adenopathy.  Neurological: She is alert and oriented to person, place, and time. She displays normal reflexes. No cranial nerve deficit or sensory deficit. She exhibits normal muscle tone. Coordination normal.  Skin: Skin is warm, dry and intact. Capillary refill takes less than 2 seconds. No rash noted. She is not diaphoretic. No erythema. No pallor.  Numerous tumors on skin  Psychiatric: She has a normal mood and affect. Her speech is normal and behavior is normal. Judgment and thought content normal. Cognition and memory are normal.  Nursing note and vitals reviewed.   Results for orders placed or performed in visit on 09/04/17  Microscopic Examination  Result Value Ref Range   WBC, UA >30 (H) 0 - 5 /hpf   RBC, UA 11-30 (A) 0 - 2 /hpf   Epithelial Cells (non renal) 0-10 0  - 10 /hpf   Mucus, UA Present (A) Not Estab.   Bacteria, UA Many (A) None seen/Few  Urinalysis, Complete  Result Value Ref Range   Specific Gravity, UA 1.025 1.005 - 1.030   pH, UA 7.0 5.0 - 7.5   Color, UA Yellow Yellow   Appearance Ur Cloudy (A) Clear   Leukocytes, UA 3+ (A) Negative   Protein, UA 3+ (A) Negative/Trace   Glucose, UA Negative Negative   Ketones, UA Negative Negative   RBC, UA 2+ (A) Negative   Bilirubin, UA Negative Negative   Urobilinogen, Ur 0.2 0.2 - 1.0 mg/dL   Nitrite, UA Negative Negative   Microscopic Examination See below:   Bladder Scan (Post Void Residual) in office  Result Value Ref Range   Scan Result 52       Assessment & Plan:   Problem List Items Addressed This Visit      Respiratory   COLD (chronic obstructive lung disease) (HCC)    Under good control on current regimen. Continue current regimen. Continue to monitor. Call with any concerns. Refills given.        Relevant Medications   albuterol (PROVENTIL HFA;VENTOLIN HFA) 108 (90 Base) MCG/ACT inhaler   tiotropium (SPIRIVA HANDIHALER) 18 MCG inhalation capsule   Other Relevant Orders   CBC with Differential/Platelet   Comprehensive metabolic panel     Musculoskeletal and Integument   Osteoporosis    Does not want to reclast. Following with Dr. Gabriel Carina. Call with any concerns. Continue to monitor.       Relevant Orders   CBC with Differential/Platelet   Comprehensive metabolic panel   TSH   VITAMIN D 25 Hydroxy (Vit-D Deficiency, Fractures)     Other   Hyperlipidemia    Rechecking levels today. Call with any concerns.  Relevant Orders   CBC with Differential/Platelet   Comprehensive metabolic panel   Lipid Panel w/o Chol/HDL Ratio   Tobacco abuse    Not interested in quitting. Will call if she changes her mind. Call with any concerns.       Relevant Orders   CBC with Differential/Platelet   Comprehensive metabolic panel   UA/M w/rflx Culture, Routine    Other  Visit Diagnoses    Routine general medical examination at a health care facility    -  Primary   Vaccines up to date. Screening labs checked today. Refuses colonoscopy. Mammogram and pap N/A. DEXA up to date.    Pyuria       Not having any symptoms. Will await culture. Continue to follow with urology.   Relevant Orders   Urine Culture       Follow up plan: Return in about 1 year (around 09/25/2018) for Physical/Wellness.   LABORATORY TESTING:  - Pap smear: not applicable  IMMUNIZATIONS:   - Tdap: Tetanus vaccination status reviewed: last tetanus booster within 10 years. - Influenza: Administered today - Pneumovax: Up to date - Prevnar: Up to date  SCREENING: -Mammogram: Not applicable  - Colonoscopy: Refused  - Bone Density: Up to date   PATIENT COUNSELING:   Advised to take 1 mg of folate supplement per day if capable of pregnancy.   Sexuality: Discussed sexually transmitted diseases, partner selection, use of condoms, avoidance of unintended pregnancy  and contraceptive alternatives.   Advised to avoid cigarette smoking.  I discussed with the patient that most people either abstain from alcohol or drink within safe limits (<=14/week and <=4 drinks/occasion for males, <=7/weeks and <= 3 drinks/occasion for females) and that the risk for alcohol disorders and other health effects rises proportionally with the number of drinks per week and how often a drinker exceeds daily limits.  Discussed cessation/primary prevention of drug use and availability of treatment for abuse.   Diet: Encouraged to adjust caloric intake to maintain  or achieve ideal body weight, to reduce intake of dietary saturated fat and total fat, to limit sodium intake by avoiding high sodium foods and not adding table salt, and to maintain adequate dietary potassium and calcium preferably from fresh fruits, vegetables, and low-fat dairy products.    stressed the importance of regular exercise  Injury  prevention: Discussed safety belts, safety helmets, smoke detector, smoking near bedding or upholstery.   Dental health: Discussed importance of regular tooth brushing, flossing, and dental visits.    NEXT PREVENTATIVE PHYSICAL DUE IN 1 YEAR. Return in about 1 year (around 09/25/2018) for Physical/Wellness.

## 2017-09-24 NOTE — Assessment & Plan Note (Signed)
Rechecking levels today. Call with any concerns.  

## 2017-09-24 NOTE — Assessment & Plan Note (Signed)
Does not want to reclast. Following with Dr. Gabriel Carina. Call with any concerns. Continue to monitor.

## 2017-09-25 ENCOUNTER — Encounter: Payer: Self-pay | Admitting: Family Medicine

## 2017-09-25 LAB — CBC WITH DIFFERENTIAL/PLATELET
BASOS: 1 %
Basophils Absolute: 0 10*3/uL (ref 0.0–0.2)
EOS (ABSOLUTE): 0.1 10*3/uL (ref 0.0–0.4)
Eos: 1 %
Hematocrit: 43.8 % (ref 34.0–46.6)
Hemoglobin: 14.5 g/dL (ref 11.1–15.9)
IMMATURE GRANS (ABS): 0 10*3/uL (ref 0.0–0.1)
IMMATURE GRANULOCYTES: 0 %
LYMPHS: 22 %
Lymphocytes Absolute: 1.6 10*3/uL (ref 0.7–3.1)
MCH: 29.4 pg (ref 26.6–33.0)
MCHC: 33.1 g/dL (ref 31.5–35.7)
MCV: 89 fL (ref 79–97)
MONOCYTES: 9 %
Monocytes Absolute: 0.7 10*3/uL (ref 0.1–0.9)
NEUTROS PCT: 67 %
Neutrophils Absolute: 4.9 10*3/uL (ref 1.4–7.0)
PLATELETS: 266 10*3/uL (ref 150–450)
RBC: 4.94 x10E6/uL (ref 3.77–5.28)
RDW: 13.5 % (ref 12.3–15.4)
WBC: 7.3 10*3/uL (ref 3.4–10.8)

## 2017-09-25 LAB — LIPID PANEL W/O CHOL/HDL RATIO
Cholesterol, Total: 211 mg/dL — ABNORMAL HIGH (ref 100–199)
HDL: 64 mg/dL (ref >39)
LDL CALC: 124 mg/dL — AB (ref 0–99)
Triglycerides: 117 mg/dL (ref 0–149)
VLDL CHOLESTEROL CAL: 23 mg/dL (ref 5–40)

## 2017-09-25 LAB — COMPREHENSIVE METABOLIC PANEL
ALT: 7 IU/L (ref 0–32)
AST: 12 IU/L (ref 0–40)
Albumin/Globulin Ratio: 1.8 (ref 1.2–2.2)
Albumin: 4.1 g/dL (ref 3.5–4.8)
Alkaline Phosphatase: 84 IU/L (ref 39–117)
BUN/Creatinine Ratio: 14 (ref 12–28)
BUN: 13 mg/dL (ref 8–27)
Bilirubin Total: 0.5 mg/dL (ref 0.0–1.2)
CALCIUM: 9 mg/dL (ref 8.7–10.3)
CO2: 20 mmol/L (ref 20–29)
Chloride: 106 mmol/L (ref 96–106)
Creatinine, Ser: 0.95 mg/dL (ref 0.57–1.00)
GFR calc Af Amer: 69 mL/min/{1.73_m2} (ref >59)
GFR, EST NON AFRICAN AMERICAN: 60 mL/min/{1.73_m2} (ref >59)
GLUCOSE: 90 mg/dL (ref 65–99)
Globulin, Total: 2.3 g/dL (ref 1.5–4.5)
Potassium: 5.1 mmol/L (ref 3.5–5.2)
Sodium: 139 mmol/L (ref 134–144)
TOTAL PROTEIN: 6.4 g/dL (ref 6.0–8.5)

## 2017-09-25 LAB — TSH: TSH: 0.666 u[IU]/mL (ref 0.450–4.500)

## 2017-09-25 LAB — VITAMIN D 25 HYDROXY (VIT D DEFICIENCY, FRACTURES): VIT D 25 HYDROXY: 28.8 ng/mL — AB (ref 30.0–100.0)

## 2017-09-27 ENCOUNTER — Telehealth: Payer: Self-pay | Admitting: Family Medicine

## 2017-09-27 LAB — URINE CULTURE

## 2017-09-27 MED ORDER — CIPROFLOXACIN HCL 500 MG PO TABS: 500.0000 mg | ORAL_TABLET | Freq: Two times a day (BID) | ORAL | 0 refills | Status: DC

## 2017-09-27 NOTE — Telephone Encounter (Signed)
Please let Sonia Side know that her urine grew out an infection, so I've sent an antibiotic to her pharmacy. Thanks!

## 2017-09-27 NOTE — Telephone Encounter (Signed)
Pt notified of lab results and and verbalized understanding to pick up the antibiotic from her pharmacy.

## 2017-10-11 DIAGNOSIS — H2513 Age-related nuclear cataract, bilateral: Secondary | ICD-10-CM | POA: Diagnosis not present

## 2018-03-05 ENCOUNTER — Ambulatory Visit: Payer: Medicare HMO | Admitting: Urology

## 2018-03-14 ENCOUNTER — Ambulatory Visit: Payer: Medicare HMO

## 2018-03-18 ENCOUNTER — Ambulatory Visit
Admission: RE | Admit: 2018-03-18 | Discharge: 2018-03-18 | Disposition: A | Discharge status: Home / Self Care | Payer: Medicare HMO | Source: Ambulatory Visit | Attending: Urology | Admitting: Urology

## 2018-03-18 ENCOUNTER — Other Ambulatory Visit: Payer: Self-pay

## 2018-03-18 DIAGNOSIS — N329 Bladder disorder, unspecified: Secondary | ICD-10-CM | POA: Diagnosis not present

## 2018-03-18 DIAGNOSIS — R339 Retention of urine, unspecified: Secondary | ICD-10-CM | POA: Diagnosis not present

## 2018-03-18 DIAGNOSIS — R9389 Abnormal findings on diagnostic imaging of other specified body structures: Secondary | ICD-10-CM | POA: Diagnosis not present

## 2018-03-19 ENCOUNTER — Ambulatory Visit: Payer: Medicare HMO | Admitting: Urology

## 2018-03-19 ENCOUNTER — Encounter: Payer: Self-pay | Admitting: Urology

## 2018-03-19 VITALS — BP 148/91 | HR 79 | Ht 61.0 in | Wt 88.1 lb

## 2018-03-19 DIAGNOSIS — Z87898 Personal history of other specified conditions: Secondary | ICD-10-CM | POA: Diagnosis not present

## 2018-03-19 DIAGNOSIS — N1330 Unspecified hydronephrosis: Secondary | ICD-10-CM

## 2018-03-19 LAB — BLADDER SCAN AMB NON-IMAGING

## 2018-03-19 NOTE — Progress Notes (Signed)
   03/19/2018 3:08 PM   Brenda Rocha 24-Apr-1945 [id number]  Reason for visit: Follow up voiding dysfunction  HPI: I saw Brenda Rocha back in urology clinic to discuss her voiding dysfunction.  To briefly summarize, she is a 73 year old female with a history of neurofibromatosis as well as history of urinary retention with bilateral hydro-requiring Foley placement.  She has been voiding spontaneously.  She previously underwent urodynamics that showed max capacity of 300 cc, first sensation 87 cc, instability during the filling phase, max flow rate of 9 cc/s, max detrusor pressure of 22 cm of water.  She has been doing well and voiding with a strong stream.  She is performing timed and double voiding as we previously recommended.  Her renal function is stable with a creatinine of 0.9, and renal ultrasound shows no upstream hydronephrosis.  PVR in clinic today is 80 cc.  Her renal/bladder ultrasound did show a possible 2 cm solid mass at the posterior aspect of the bladder.  Notably, she underwent a negative cystoscopy with Dr. Pilar Jarvis in 2018.  I recommended follow-up for cystoscopy to evaluate this bladder lesion  A total of 15 minutes were spent face-to-face with the patient, greater than 50% was spent in patient education, counseling, and coordination of care regarding voiding dysfunction and bladder mass.   Billey Co, [facility] [address] [place], [place] [address] [phone]

## 2018-03-19 NOTE — Patient Instructions (Signed)
Cystoscopy    Cystoscopy is a procedure that is used to help diagnose and sometimes treat conditions that affect that lower urinary tract. The lower urinary tract includes the bladder and the tube that drains urine from the bladder out of the body (urethra). Cystoscopy is performed with a thin, tube-shaped instrument with a light and camera at the end (cystoscope). The cystoscope may be hard (rigid) or flexible, depending on the goal of the procedure.The cystoscope is inserted through the urethra, into the bladder.  Cystoscopy may be recommended if you have:   Urinary tractinfections that keep coming back (recurring).   Blood in the urine (hematuria).   Loss of bladder control (urinary incontinence) or an overactive bladder.   Unusual cells found in a urine sample.   A blockage in the urethra.   Painful urination.   An abnormality in the bladder found during an intravenous pyelogram (IVP) or CT scan.  Cystoscopy may also be done to remove a sample of tissue to be examined under a microscope (biopsy).  Tell a health care provider about:   Any allergies you have.   All medicines you are taking, including vitamins, herbs, eye drops, creams, and over-the-counter medicines.   Any problems you or family members have had with anesthetic medicines.   Any blood disorders you have.   Any surgeries you have had.   Any medical conditions you have.   Whether you are pregnant or may be pregnant.  What are the risks?  Generally, this is a safe procedure. However, problems may occur, including:   Infection.   Bleeding.   Allergic reactions to medicines.   Damage to other structures or organs.  What happens before the procedure?   Ask your health care provider about:  ? Changing or stopping your regular medicines. This is especially important if you are taking diabetes medicines or blood thinners.  ? Taking medicines such as aspirin and ibuprofen. These medicines can thin your blood. Do not take these medicines  before your procedure if your health care provider instructs you not to.   Follow instructions from your health care provider about eating or drinking restrictions.   You may be given antibiotic medicine to help prevent infection.   You may have an exam or testing, such as X-rays of the bladder, urethra, or kidneys.   You may have urine tests to check for signs of infection.   Plan to have someone take you home after the procedure.  What happens during the procedure?   To reduce your risk of infection,your health care team will wash or sanitize their hands.   You will be given one or more of the following:  ? A medicine to help you relax (sedative).  ? A medicine to numb the area (local anesthetic).   The area around the opening of your urethra will be cleaned.   The cystoscope will be passed through your urethra into your bladder.   Germ-free (sterile)fluid will flow through the cystoscope to fill your bladder. The fluid will stretch your bladder so that your surgeon can clearly examine your bladder walls.   The cystoscope will be removed and your bladder will be emptied.  The procedure may vary among health care providers and hospitals.  What happens after the procedure?   You may have some soreness or pain in your abdomen and urethra. Medicines will be available to help you.   You may have some blood in your urine.   Do not drive for   24 hours if you received a sedative.  This information is not intended to replace advice given to you by your health care provider. Make sure you discuss any questions you have with your health care provider.  Document Released: 12/31/1999 Document Revised: 10/13/2016 Document Reviewed: 11/19/2014  Elsevier Interactive Patient Education  2019 [employer]

## 2018-03-26 ENCOUNTER — Other Ambulatory Visit: Payer: Medicare HMO | Admitting: Urology

## 2018-03-28 ENCOUNTER — Other Ambulatory Visit: Payer: Self-pay

## 2018-03-28 ENCOUNTER — Encounter: Payer: Self-pay | Admitting: Urology

## 2018-03-28 ENCOUNTER — Ambulatory Visit: Payer: Medicare HMO | Admitting: Urology

## 2018-03-28 VITALS — BP 185/80 | HR 80 | Ht 61.0 in | Wt 88.0 lb

## 2018-03-28 DIAGNOSIS — Z87898 Personal history of other specified conditions: Secondary | ICD-10-CM

## 2018-03-28 DIAGNOSIS — R319 Hematuria, unspecified: Secondary | ICD-10-CM | POA: Diagnosis not present

## 2018-03-28 DIAGNOSIS — R3129 Other microscopic hematuria: Secondary | ICD-10-CM | POA: Diagnosis not present

## 2018-03-28 LAB — MICROSCOPIC EXAMINATION: Epithelial Cells (non renal): >10 /hpf — AB (ref 0–10)

## 2018-03-28 LAB — URINALYSIS, COMPLETE
BILIRUBIN UA: NEGATIVE
Glucose, UA: NEGATIVE
Ketones, UA: NEGATIVE
Leukocytes, UA: NEGATIVE
Nitrite, UA: NEGATIVE
Protein, UA: NEGATIVE
SPEC GRAV UA: 1.025 (ref 1.005–1.030)
UUROB: 0.2 mg/dL (ref 0.2–1.0)
pH, UA: 6 (ref 5.0–7.5)

## 2018-03-28 NOTE — Progress Notes (Signed)
Cystoscopy Procedure Note:  Indication: Possible bladder mass on ultrasound  After informed consent and discussion of the procedure and its risks, Brenda Rocha was positioned and prepped in the standard fashion. Cystoscopy was performed with a flexible cystoscope. The urethra, bladder neck and entire bladder was visualized in a standard fashion. The bladder had moderate diverticula, however the mucosa was grossly normal.  No tumors or lesions seen.  Retroflexion demonstrated a normal bladder neck.  Findings: Moderate bladder trabeculations, no bladder tumor  Assessment and Plan: Follow-up 1 year with [facility]  Nickolas Madrid, MD 03/28/2018

## 2018-04-02 ENCOUNTER — Other Ambulatory Visit: Payer: Self-pay | Admitting: Urology

## 2018-04-02 NOTE — Progress Notes (Signed)
No cancer cells seen on urine sample from cystoscopy, great news. Keep scheduled follow up  Nickolas Madrid, MD 04/02/2018

## 2018-04-03 ENCOUNTER — Telehealth: Payer: Self-pay

## 2018-04-03 NOTE — Telephone Encounter (Signed)
-----   Message from Billey Co, MD sent at 04/02/2018  7:03 PM EDT -----   ----- Message ----- From: Noralyn Pick Sent: 04/02/2018   4:21 PM EDT To: Billey Co, MD

## 2018-04-03 NOTE — Telephone Encounter (Signed)
No cancer cells seen on urine sample from cystoscopy, great news. Keep scheduled follow up  Nickolas Madrid, MD  Patient notified on vmail per DPR

## 2018-05-24 IMAGING — US US RENAL
1 series · 14 of 25 positions shown · non-contrast
Comparison: CT 11/21/2016.

CLINICAL DATA: Hydronephrosis.

EXAM:
RENAL / URINARY TRACT ULTRASOUND COMPLETE

[Series 1: us renal · 0.20mm/px · 14 of 61 slices shown]
[im 1/61]
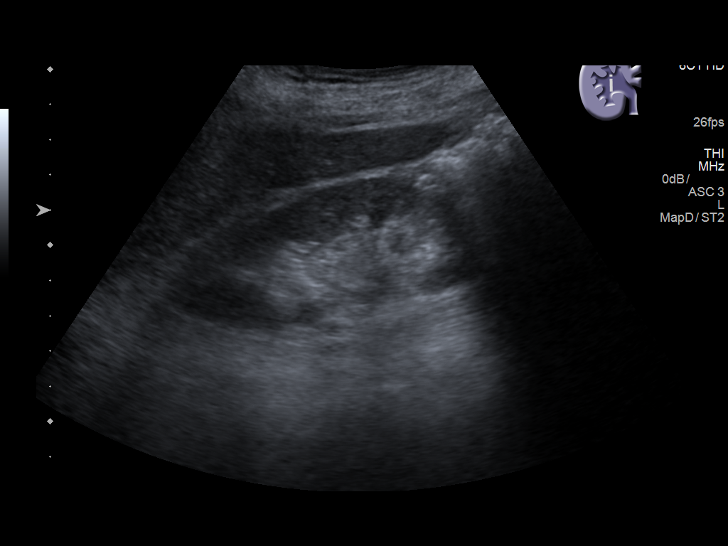
[im 6/61]
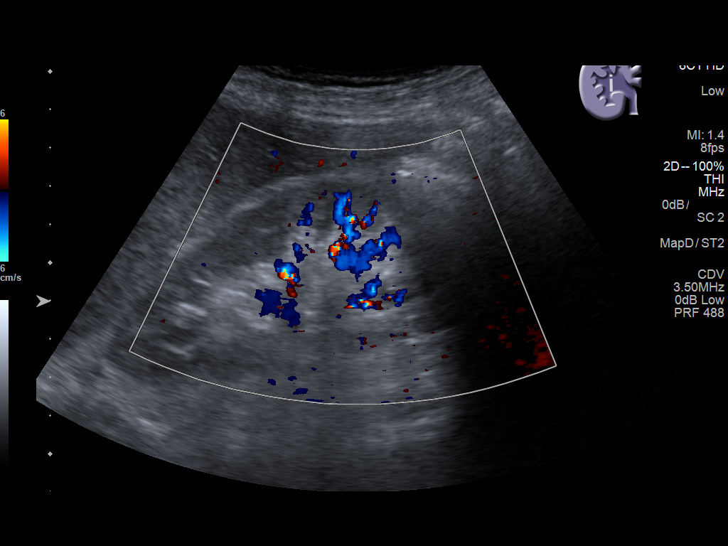
[im 11/61]
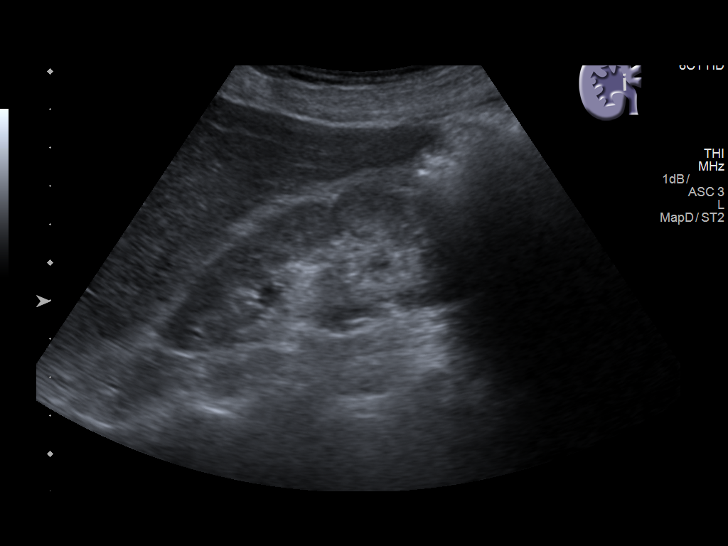
[im 16/61]
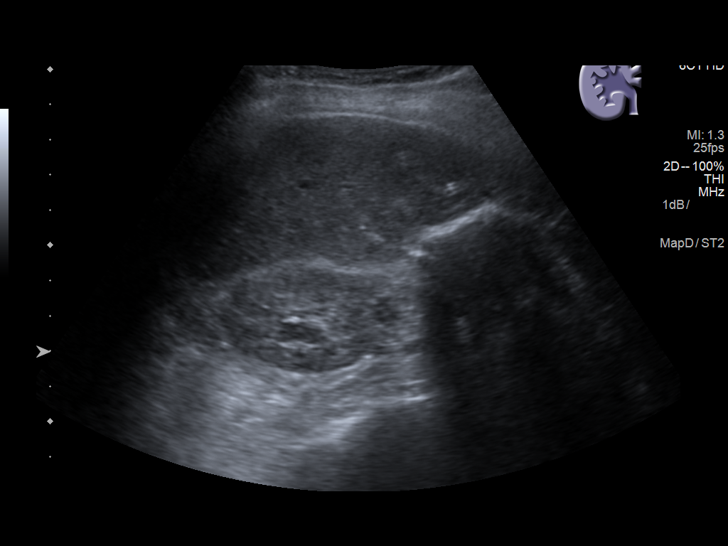
[im 21/61]
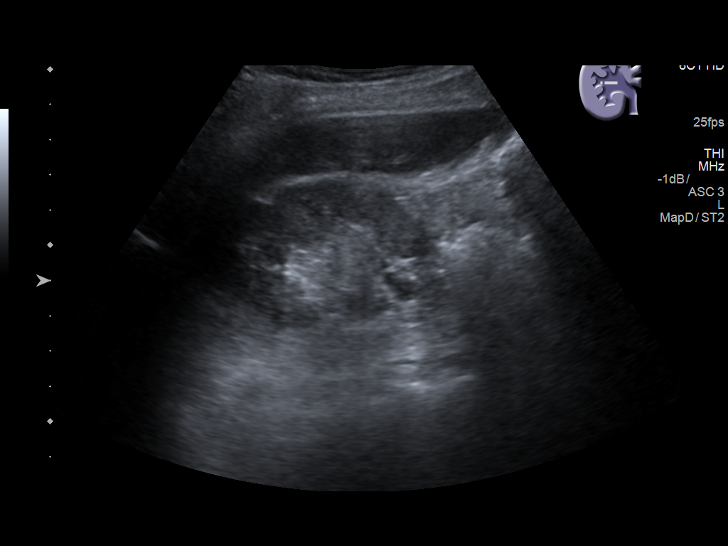
[im 23/61]
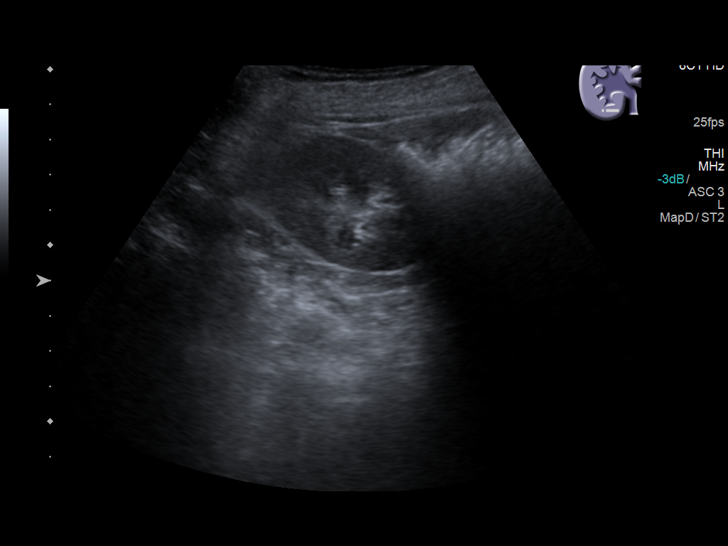
[im 28/61]
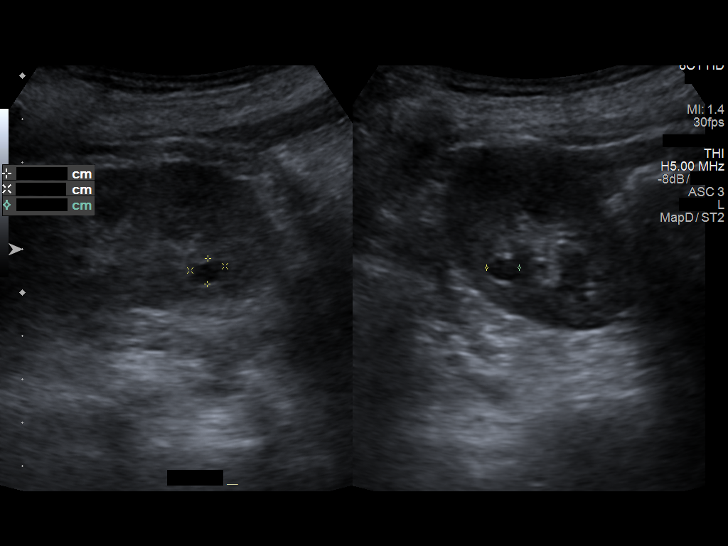
[im 33/61]
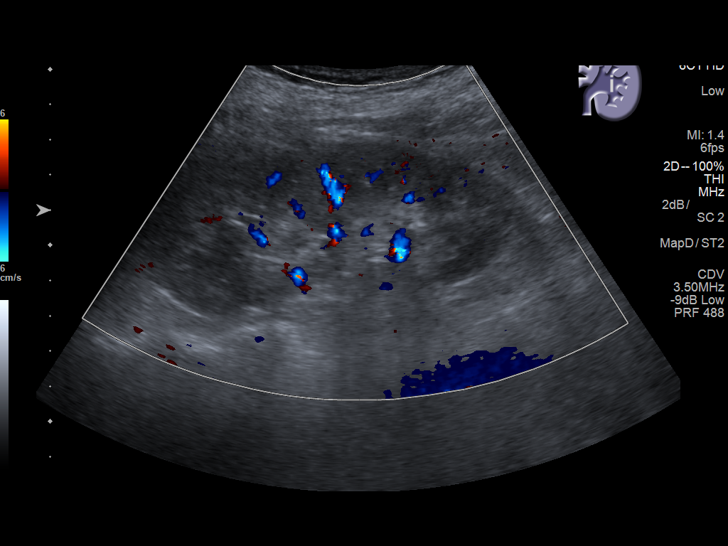
[im 38/61]
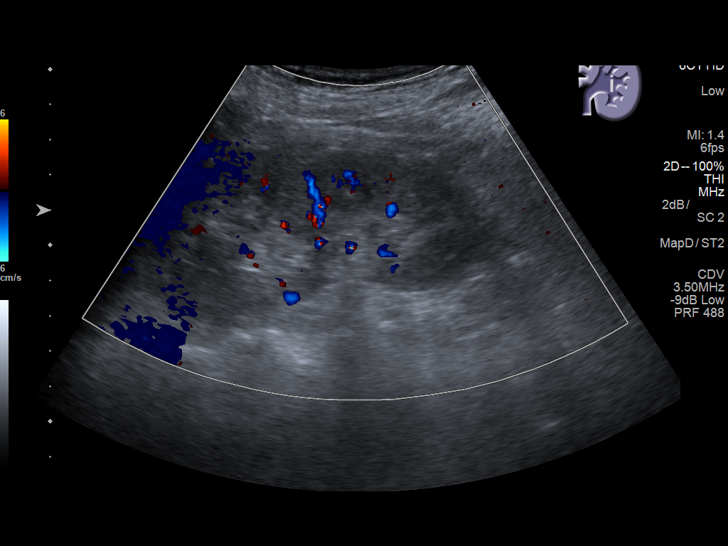
[im 41/61]
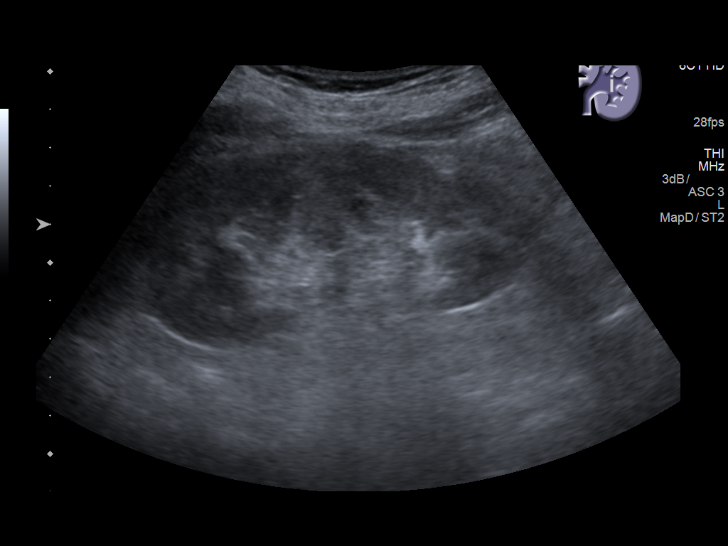
[im 46/61]
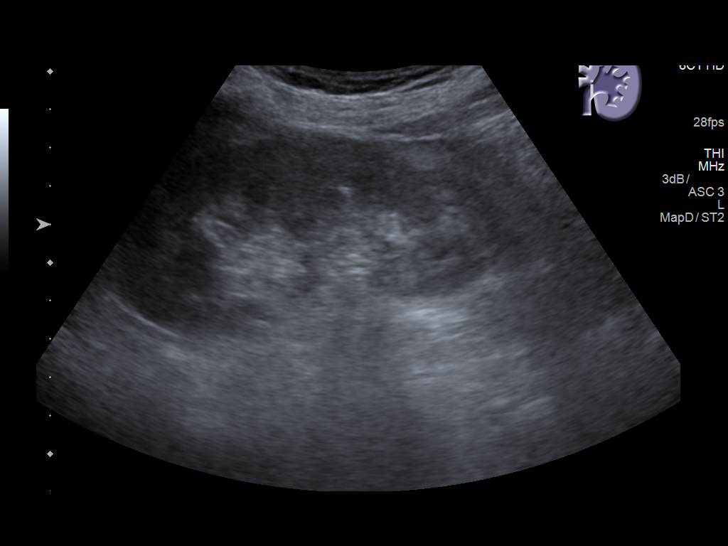
[im 51/61]
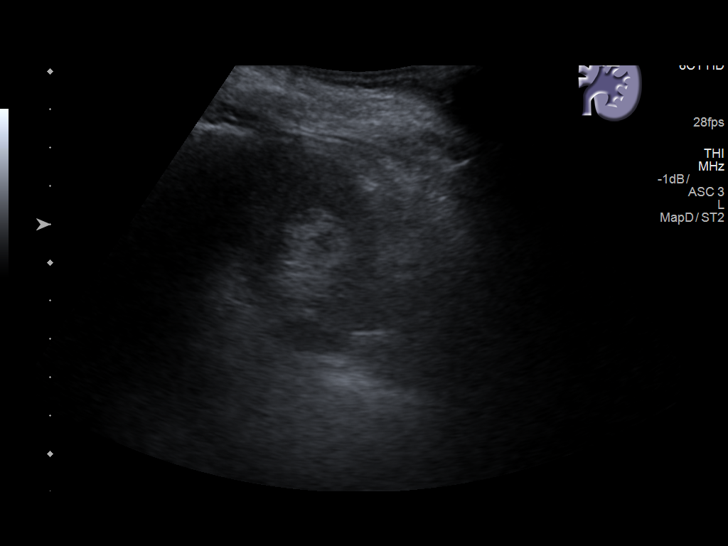
[im 56/61]
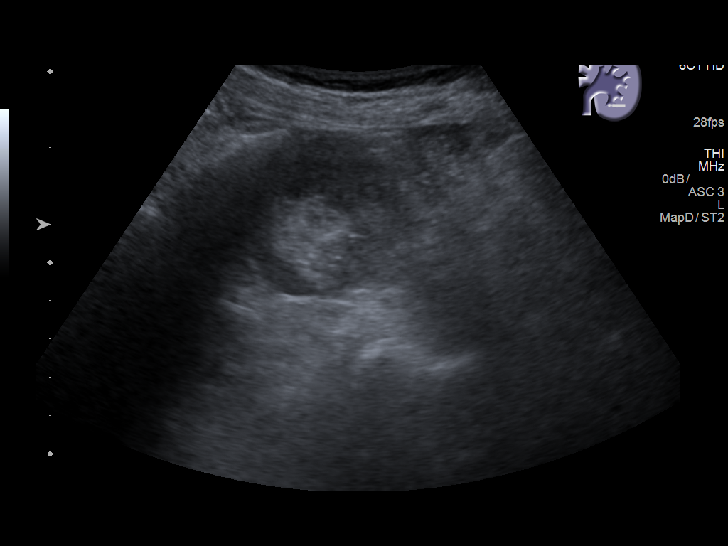
[im 61/61]
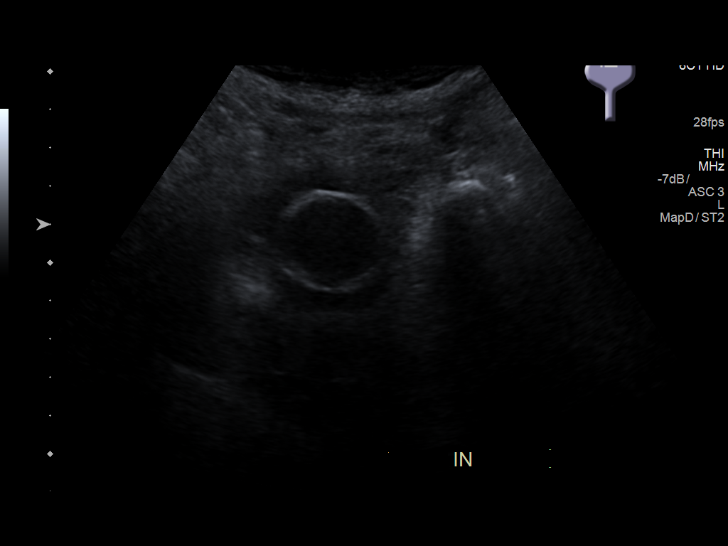

[14 of 25 positions shown; findings below may reference images not displayed]

FINDINGS: Right Kidney:

Length: 11.1 cm. Echogenicity within normal limits. 8 mm simple cyst
lower pole. No hydronephrosis visualized.

Left Kidney:

Length: 10.2 cm. Echogenicity within normal limits. No mass or
hydronephrosis visualized.

Bladder:

Foley catheter in place.  Bladder nondistended.
IMPRESSION: 1. 8 mm simple cyst lower pole right kidney. No acute renal
abnormality. No hydronephrosis.

2.  Foley catheter in place.  Bladder nondistended

## 2018-06-25 DIAGNOSIS — H5213 Myopia, bilateral: Secondary | ICD-10-CM | POA: Diagnosis not present

## 2018-07-12 DIAGNOSIS — H2511 Age-related nuclear cataract, right eye: Secondary | ICD-10-CM | POA: Diagnosis not present

## 2018-07-12 DIAGNOSIS — Z01818 Encounter for other preprocedural examination: Secondary | ICD-10-CM | POA: Diagnosis not present

## 2018-07-12 DIAGNOSIS — H25811 Combined forms of age-related cataract, right eye: Secondary | ICD-10-CM | POA: Diagnosis not present

## 2018-07-29 ENCOUNTER — Ambulatory Visit (INDEPENDENT_AMBULATORY_CARE_PROVIDER_SITE_OTHER): Payer: Medicare HMO | Admitting: Family Medicine

## 2018-07-29 ENCOUNTER — Other Ambulatory Visit: Payer: Self-pay

## 2018-07-29 ENCOUNTER — Encounter: Payer: Self-pay | Admitting: Family Medicine

## 2018-07-29 VITALS — BP 112/72 | HR 80 | Temp 98.5°F | Ht 61.0 in | Wt 88.0 lb

## 2018-07-29 DIAGNOSIS — L03116 Cellulitis of left lower limb: Secondary | ICD-10-CM

## 2018-07-29 MED ORDER — SULFAMETHOXAZOLE-TRIMETHOPRIM 800-160 MG PO TABS: 1.0000 | ORAL_TABLET | Freq: Two times a day (BID) | ORAL | 0 refills | Status: DC

## 2018-07-29 NOTE — Progress Notes (Signed)
BP 112/72   Pulse 80   Temp 98.5 F (36.9 C) (Oral)   Ht 5\' 1"  (1.549 m)   Wt 88 lb (39.9 kg)   SpO2 96%   BMI 16.63 kg/m    Subjective:    Patient ID: Brenda Rocha, female    DOB: 1945-07-05, 73 y.o.   MRN: [id number]  HPI: Brenda Rocha is a 73 y.o. female  Chief Complaint  Patient presents with  . Leg Swelling    left side redness and warm for about 4-5 days ago. tried OTC tylenol   SKIN INFECTION Duration: 4-5 days Location: L leg, just to the L of her knee History of trauma in area: no Pain: yes Quality: aching and sore Severity: severe Redness: yes Swelling: yes Oozing: no Pus: no Fevers: no Nausea/vomiting: no Status: worse Treatments attempted:tylenol Tetanus: UTD  Relevant past medical, surgical, family and social history reviewed and updated as indicated. Interim medical history since our last visit reviewed. Allergies and medications reviewed and updated.  Review of Systems  Constitutional: Negative.   Respiratory: Negative.   Cardiovascular: Negative.   Gastrointestinal: Negative.   Musculoskeletal: Negative for arthralgias, back pain, gait problem, joint swelling, myalgias, neck pain and neck stiffness.  Skin: Positive for color change. Negative for pallor, rash and wound.  Psychiatric/Behavioral: Negative.     Per HPI unless specifically indicated above     Objective:    BP 112/72   Pulse 80   Temp 98.5 F (36.9 C) (Oral)   Ht 5\' 1"  (1.549 m)   Wt 88 lb (39.9 kg)   SpO2 96%   BMI 16.63 kg/m   Wt Readings from Last 3 Encounters:  07/29/18 88 lb (39.9 kg)  03/28/18 88 lb (39.9 kg)  03/19/18 88 lb 1.6 oz (40 kg)    Physical Exam Vitals signs and nursing note reviewed.  Constitutional:      General: She is not in acute distress.    Appearance: Normal appearance. She is not ill-appearing, toxic-appearing or diaphoretic.  HENT:     Head: Normocephalic and atraumatic.     Right Ear: External ear normal.     Left Ear:  External ear normal.     Nose: Nose normal.     Mouth/Throat:     Mouth: Mucous membranes are moist.     Pharynx: Oropharynx is clear.  Eyes:     General: No scleral icterus.       Right eye: No discharge.        Left eye: No discharge.     Extraocular Movements: Extraocular movements intact.     Conjunctiva/sclera: Conjunctivae normal.     Pupils: Pupils are equal, round, and reactive to Rocha.  Neck:     Musculoskeletal: Normal range of motion and neck supple.  Cardiovascular:     Rate and Rhythm: Normal rate and regular rhythm.     Pulses: Normal pulses.     Heart sounds: Normal heart sounds. No murmur. No friction rub. No gallop.   Pulmonary:     Effort: Pulmonary effort is normal. No respiratory distress.     Breath sounds: Normal breath sounds. No stridor. No wheezing, rhonchi or rales.  Chest:     Chest wall: No tenderness.  Musculoskeletal: Normal range of motion.  Skin:    General: Skin is warm and dry.     Capillary Refill: Capillary refill takes less than 2 seconds.     Coloration: Skin is not jaundiced or pale.  Findings: Erythema (2-3 inch area on lateral side of L knee, hot, swollen, bright red) present. No bruising, lesion or rash.  Neurological:     General: No focal deficit present.     Mental Status: She is alert and oriented to person, place, and time. Mental status is at baseline.  Psychiatric:        Mood and Affect: Mood normal.        Behavior: Behavior normal.        Thought Content: Thought content normal.        Judgment: Judgment normal.     Results for orders placed or performed in visit on 03/28/18  Microscopic Examination   URINE  Result Value Ref Range   WBC, UA 0-5 0 - 5 /hpf   RBC, UA 0-2 0 - 2 /hpf   Epithelial Cells (non renal) >10 (A) 0 - 10 /hpf   Bacteria, UA Few None seen/Few  Urinalysis, Complete  Result Value Ref Range   Specific Gravity, UA 1.025 1.005 - 1.030   pH, UA 6.0 5.0 - 7.5   Color, UA Yellow Yellow    Appearance Ur Clear Clear   Leukocytes, UA Negative Negative   Protein, UA Negative Negative/Trace   Glucose, UA Negative Negative   Ketones, UA Negative Negative   RBC, UA Trace (A) Negative   Bilirubin, UA Negative Negative   Urobilinogen, Ur 0.2 0.2 - 1.0 mg/dL   Nitrite, UA Negative Negative   Microscopic Examination See below:       Assessment & Plan:   Problem List Items Addressed This Visit    None    Visit Diagnoses    Cellulitis of left leg    -  Primary   Will treat with bactrim and recheck 3 days. Call with any concerns. Continue to monitor.        Follow up plan: Return Thursday, for recheck cellulitis.

## 2018-07-29 NOTE — Patient Instructions (Signed)

## 2018-08-01 ENCOUNTER — Ambulatory Visit: Payer: Medicare HMO | Admitting: Family Medicine

## 2018-08-02 ENCOUNTER — Encounter: Payer: Self-pay | Admitting: Family Medicine

## 2018-08-02 ENCOUNTER — Telehealth (INDEPENDENT_AMBULATORY_CARE_PROVIDER_SITE_OTHER): Payer: Medicare HMO | Admitting: Family Medicine

## 2018-08-02 ENCOUNTER — Other Ambulatory Visit: Payer: Self-pay

## 2018-08-02 DIAGNOSIS — L03116 Cellulitis of left lower limb: Secondary | ICD-10-CM | POA: Diagnosis not present

## 2018-08-02 NOTE — Progress Notes (Signed)
There were no vitals taken for this visit.   Subjective:    Patient ID: Brenda Rocha, female    DOB: [birth year], 73 y.o.   MRN: [id number]  HPI: Brenda Rocha is a 73 y.o. female  No chief complaint on file.  SKIN INFECTION Duration: 8-10 days Location: L leg just below her knee History of trauma in area: no Pain: yes Quality: aching and sore Severity: moderate Redness: yes Swelling: yes Oozing: no Pus: no Fevers: no Nausea/vomiting: no Status: better Treatments attempted:ice pack, antibiotics and warm compresses  Tetanus: UTD   Relevant past medical, surgical, family and social history reviewed and updated as indicated. Interim medical history since our last visit reviewed. Allergies and medications reviewed and updated.  Review of Systems  Constitutional: Negative.   Respiratory: Negative.   Cardiovascular: Negative.   Gastrointestinal: Negative.   Skin: Positive for color change. Negative for pallor, rash and wound.       Red and   Neurological: Negative.   Psychiatric/Behavioral: Negative.     Per HPI unless specifically indicated above     Objective:    There were no vitals taken for this visit.  Wt Readings from Last 3 Encounters:  07/29/18 88 lb (39.9 kg)  03/28/18 88 lb (39.9 kg)  03/19/18 88 lb 1.6 oz (40 kg)    Physical Exam Vitals signs and nursing note reviewed.  Pulmonary:     Effort: Pulmonary effort is normal. No respiratory distress.     Comments: Speaking in full sentences Neurological:     Mental Status: She is alert.  Psychiatric:        Mood and Affect: Mood normal.        Behavior: Behavior normal.        Thought Content: Thought content normal.        Judgment: Judgment normal.     Results for orders placed or performed in visit on 03/28/18  Microscopic Examination   URINE  Result Value Ref Range   WBC, UA 0-5 0 - 5 /hpf   RBC, UA 0-2 0 - 2 /hpf   Epithelial Cells (non renal) >10 (A) 0 - 10 /hpf   Bacteria, UA  Few None seen/Few  Urinalysis, Complete  Result Value Ref Range   Specific Gravity, UA 1.025 1.005 - 1.030   pH, UA 6.0 5.0 - 7.5   Color, UA Yellow Yellow   Appearance Ur Clear Clear   Leukocytes, UA Negative Negative   Protein, UA Negative Negative/Trace   Glucose, UA Negative Negative   Ketones, UA Negative Negative   RBC, UA Trace (A) Negative   Bilirubin, UA Negative Negative   Urobilinogen, Ur 0.2 0.2 - 1.0 mg/dL   Nitrite, UA Negative Negative   Microscopic Examination See below:       Assessment & Plan:   Problem List Items Addressed This Visit    None    Visit Diagnoses    Cellulitis of left leg    -  Primary   Getting better, but still red and swollen. Will recheck on Tuesday to confirm getting better.        Follow up plan: Tuesday 1:45 for follow up   . This visit was completed via telephone due to the restrictions of the COVID-19 pandemic. All issues as above were discussed and addressed but no physical exam was performed. If it was felt that the patient should be evaluated in the office, they were directed there. The patient verbally consented  to this visit. Patient was unable to complete an audio/visual visit due to Lack of equipment. Due to the catastrophic nature of the COVID-19 pandemic, this visit was done through audio contact only. . Location of the patient: home . Location of the provider: work . Those involved with this call:  . Provider: Park Liter, DO . CMA: Yvonna Alanis, [place] . Front Desk/Registration: Don Perking  . Time spent on call: 15 minutes on the phone discussing health concerns. 23 minutes total spent in review of patient's record and preparation of their chart.

## 2018-08-06 ENCOUNTER — Encounter: Payer: Self-pay | Admitting: Family Medicine

## 2018-08-06 ENCOUNTER — Other Ambulatory Visit: Payer: Medicare HMO

## 2018-08-06 ENCOUNTER — Ambulatory Visit (INDEPENDENT_AMBULATORY_CARE_PROVIDER_SITE_OTHER): Payer: Medicare HMO | Admitting: Family Medicine

## 2018-08-06 ENCOUNTER — Other Ambulatory Visit: Payer: Self-pay

## 2018-08-06 VITALS — BP 119/78 | HR 91 | Temp 98.1°F

## 2018-08-06 DIAGNOSIS — IMO0002 Reserved for concepts with insufficient information to code with codable children: Secondary | ICD-10-CM

## 2018-08-06 DIAGNOSIS — L03116 Cellulitis of left lower limb: Secondary | ICD-10-CM | POA: Diagnosis not present

## 2018-08-06 DIAGNOSIS — R229 Localized swelling, mass and lump, unspecified: Secondary | ICD-10-CM | POA: Diagnosis not present

## 2018-08-06 NOTE — Progress Notes (Signed)
BP 119/78   Pulse 91   Temp 98.1 F (36.7 C) (Oral)   SpO2 97%    Subjective:    Patient ID: Brenda Rocha, female    DOB: Jun 27, 1945, 73 y.o.   MRN: [id number]  HPI: Brenda Rocha is a 72 y.o. female  Chief Complaint  Patient presents with  . Follow-up    Cellulitis recheck. Better, has gone down. Now only painful occassionally.    LUMP Duration: 1 week Location: L leg over fibular head Onset: sudden Painful: yes Discomfort: yes Status:  bigger Trauma: no Redness: yes Bruising: no Recent infection: yes Swollen lymph nodes: no Requesting removal: no History of cancer: no History of the same: no   Relevant past medical, surgical, family and social history reviewed and updated as indicated. Interim medical history since our last visit reviewed. Allergies and medications reviewed and updated.  Review of Systems  Constitutional: Negative.   Respiratory: Negative.   Cardiovascular: Negative.   Musculoskeletal: Positive for arthralgias. Negative for back pain, gait problem, joint swelling, myalgias, neck pain and neck stiffness.  Skin: Negative.   Neurological: Negative.   Psychiatric/Behavioral: Negative.     Per HPI unless specifically indicated above     Objective:    BP 119/78   Pulse 91   Temp 98.1 F (36.7 C) (Oral)   SpO2 97%   Wt Readings from Last 3 Encounters:  07/29/18 88 lb (39.9 kg)  03/28/18 88 lb (39.9 kg)  03/19/18 88 lb 1.6 oz (40 kg)    Physical Exam Vitals signs and nursing note reviewed.  Constitutional:      General: She is not in acute distress.    Appearance: Normal appearance. She is not ill-appearing, toxic-appearing or diaphoretic.  HENT:     Head: Normocephalic and atraumatic.     Right Ear: External ear normal.     Left Ear: External ear normal.     Nose: Nose normal.     Mouth/Throat:     Mouth: Mucous membranes are moist.     Pharynx: Oropharynx is clear.  Eyes:     General: No scleral icterus.       Right  eye: No discharge.        Left eye: No discharge.     Extraocular Movements: Extraocular movements intact.     Conjunctiva/sclera: Conjunctivae normal.     Pupils: Pupils are equal, round, and reactive to Rocha.  Neck:     Musculoskeletal: Normal range of motion and neck supple.  Cardiovascular:     Rate and Rhythm: Normal rate and regular rhythm.     Pulses: Normal pulses.     Heart sounds: Normal heart sounds. No murmur. No friction rub. No gallop.   Pulmonary:     Effort: Pulmonary effort is normal. No respiratory distress.     Breath sounds: Normal breath sounds. No stridor. No wheezing, rhonchi or rales.  Chest:     Chest wall: No tenderness.  Musculoskeletal: Normal range of motion.     Comments: 2 inch lump over fibular head on the L, tender to palpation, No more redness, no more swelling, slightly dusky in color  Skin:    General: Skin is warm and dry.     Capillary Refill: Capillary refill takes less than 2 seconds.     Coloration: Skin is not jaundiced or pale.     Findings: No bruising, erythema, lesion or rash.  Neurological:     General: No focal deficit present.  Mental Status: She is alert and oriented to person, place, and time. Mental status is at baseline.  Psychiatric:        Mood and Affect: Mood normal.        Behavior: Behavior normal.        Thought Content: Thought content normal.        Judgment: Judgment normal.     Results for orders placed or performed in visit on 03/28/18  Microscopic Examination   URINE  Result Value Ref Range   WBC, UA 0-5 0 - 5 /hpf   RBC, UA 0-2 0 - 2 /hpf   Epithelial Cells (non renal) >10 (A) 0 - 10 /hpf   Bacteria, UA Few None seen/Few  Urinalysis, Complete  Result Value Ref Range   Specific Gravity, UA 1.025 1.005 - 1.030   pH, UA 6.0 5.0 - 7.5   Color, UA Yellow Yellow   Appearance Ur Clear Clear   Leukocytes, UA Negative Negative   Protein, UA Negative Negative/Trace   Glucose, UA Negative Negative    Ketones, UA Negative Negative   RBC, UA Trace (A) Negative   Bilirubin, UA Negative Negative   Urobilinogen, Ur 0.2 0.2 - 1.0 mg/dL   Nitrite, UA Negative Negative   Microscopic Examination See below:       Assessment & Plan:   Problem List Items Addressed This Visit    None    Visit Diagnoses    Lump    -  Primary   Of unclear etiology. Given rapid onset and significant pain, concern for neoplasm. Will obtain US to see what this is. Await results.   Relevant Orders   [place] LT LOWER EXTREM LTD SOFT TISSUE NON VASCULAR   Cellulitis of left leg       Resolved.        Follow up plan: Return Pending [place] results.

## 2018-08-08 ENCOUNTER — Ambulatory Visit
Admission: RE | Admit: 2018-08-08 | Discharge: 2018-08-08 | Disposition: A | Discharge status: Home / Self Care | Payer: Medicare HMO | Source: Ambulatory Visit | Attending: Family Medicine | Admitting: Family Medicine

## 2018-08-08 ENCOUNTER — Other Ambulatory Visit: Payer: Self-pay

## 2018-08-08 ENCOUNTER — Telehealth: Payer: Self-pay | Admitting: Family Medicine

## 2018-08-08 DIAGNOSIS — R229 Localized swelling, mass and lump, unspecified: Secondary | ICD-10-CM | POA: Diagnosis not present

## 2018-08-08 DIAGNOSIS — R2242 Localized swelling, mass and lump, left lower limb: Secondary | ICD-10-CM | POA: Diagnosis not present

## 2018-08-08 DIAGNOSIS — IMO0002 Reserved for concepts with insufficient information to code with codable children: Secondary | ICD-10-CM

## 2018-08-08 NOTE — Telephone Encounter (Signed)
Message relayed to patient. Verbalized understanding and denied questions.   

## 2018-08-08 NOTE — Telephone Encounter (Signed)
[place] came back- of unclear etiology, recommended MRI of leg w/wo- ordered. They will call her. Await results. Please let patient know.

## 2018-08-09 ENCOUNTER — Telehealth: Payer: Self-pay | Admitting: Family Medicine

## 2018-08-09 NOTE — Telephone Encounter (Signed)
Patient returned my call. I let her know about results. She said that her and Jada's call got off yesterday so she didn't get complete results. She verbalized understanding about MRI.

## 2018-08-09 NOTE — Telephone Encounter (Signed)
Brenda Rocha discussed results with patient yesterday. They saw a lump. They are not sure what it was. She needs an MRI to find out what it was. They will be calling her about the MRI to get it scheduled. If she has any questions- please let me know, but I'm a bit confused as we called her yesterday.

## 2018-08-09 NOTE — Telephone Encounter (Signed)
Called and left patient a VM asking for her to please return my call.  

## 2018-08-09 NOTE — Telephone Encounter (Signed)
Copied from [place] [phone]. Topic: General - Other >> Aug 09, 2018 11:59 AM Yvette Rack wrote: Reason for CRM: Pt called requesting ultrasound results. Pt requests call back.

## 2018-08-11 IMAGING — US US RENAL
1 series · 14 of 25 positions shown · non-contrast
Comparison: 12/04/2016

CLINICAL DATA: Urinary retention and hydronephrosis.

EXAM:
RENAL / URINARY TRACT ULTRASOUND COMPLETE

[Series 1: us renal · 0.20mm/px · 14 of 67 slices shown]
[im 1/67]
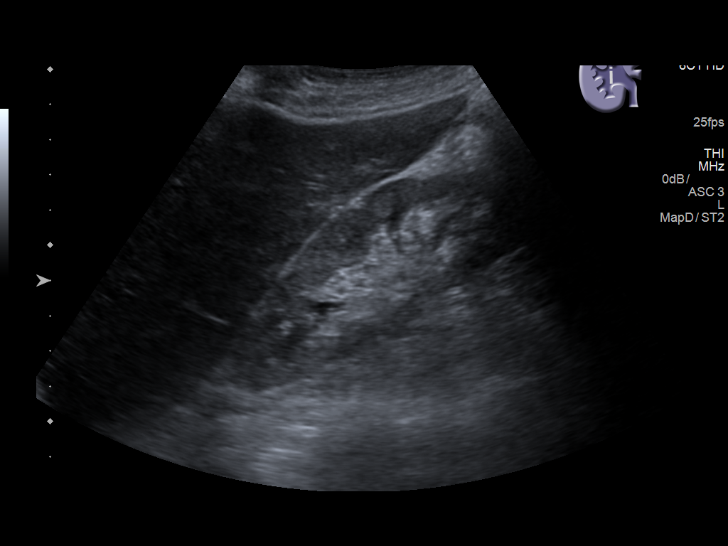
[im 6/67]
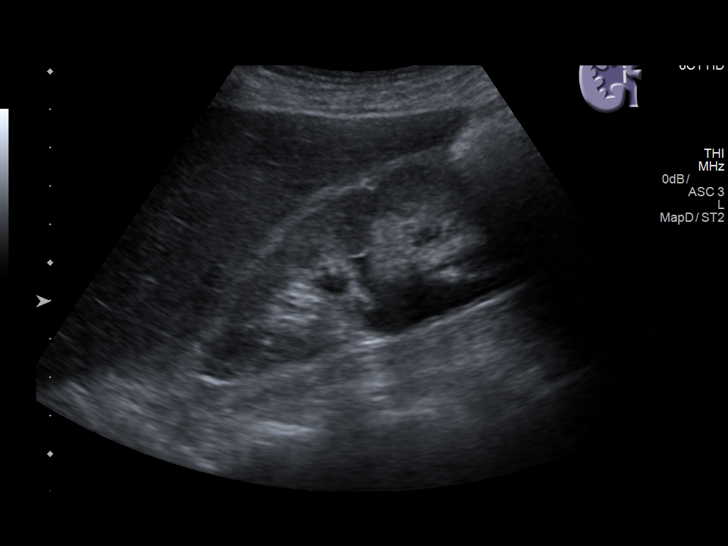
[im 12/67]
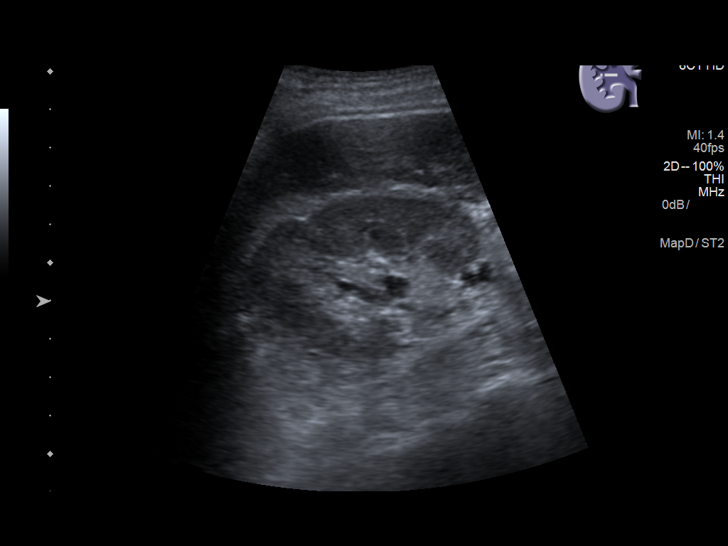
[im 17/67]
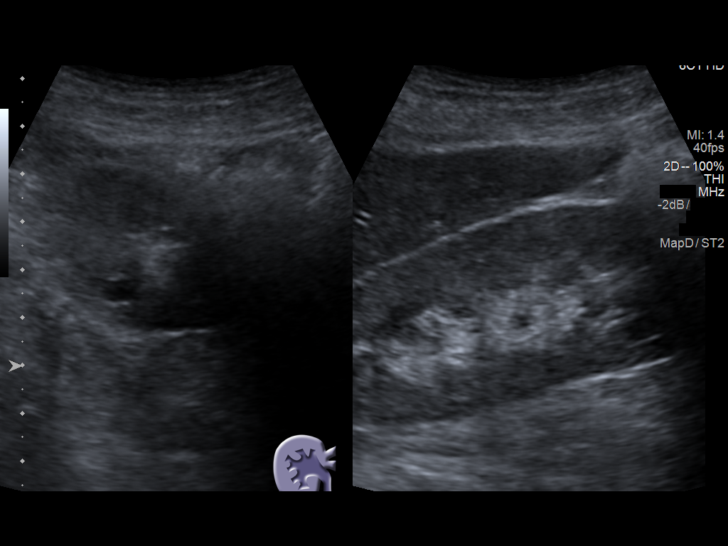
[im 23/67]
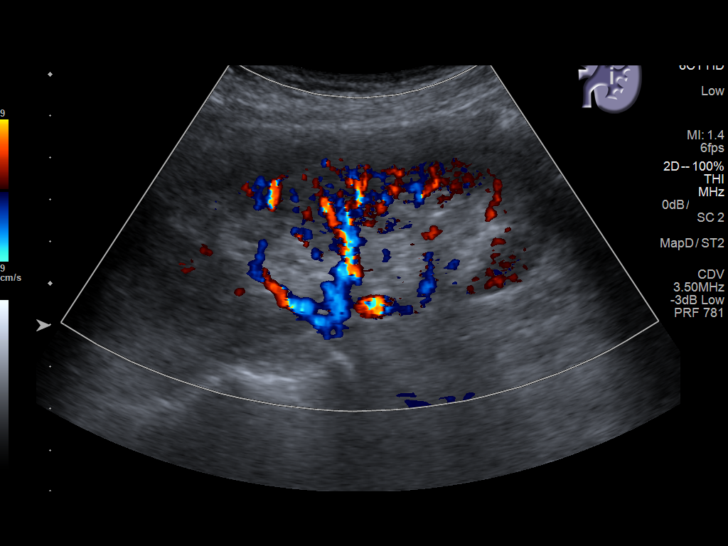
[im 25/67]
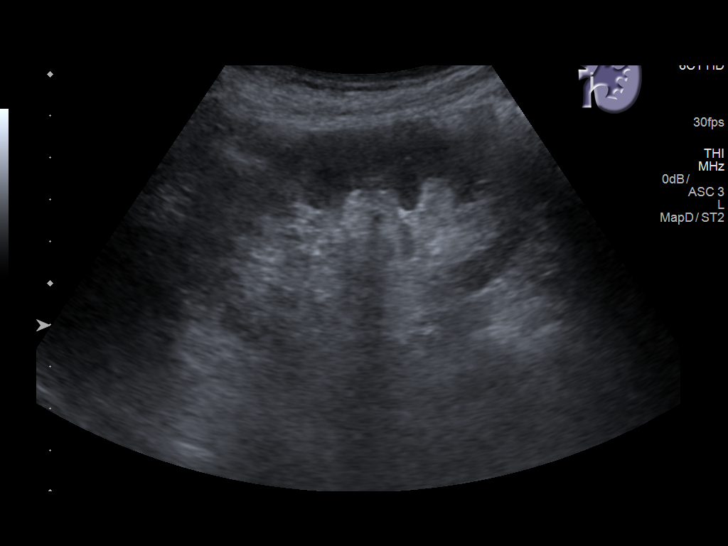
[im 31/67]
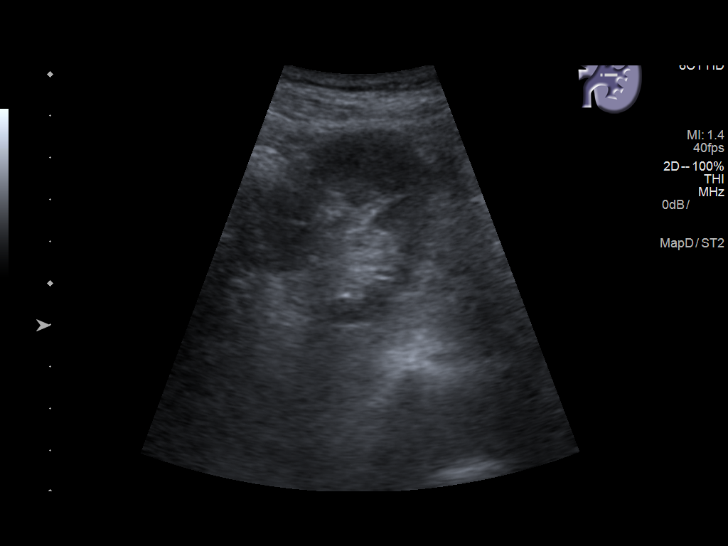
[im 36/67]
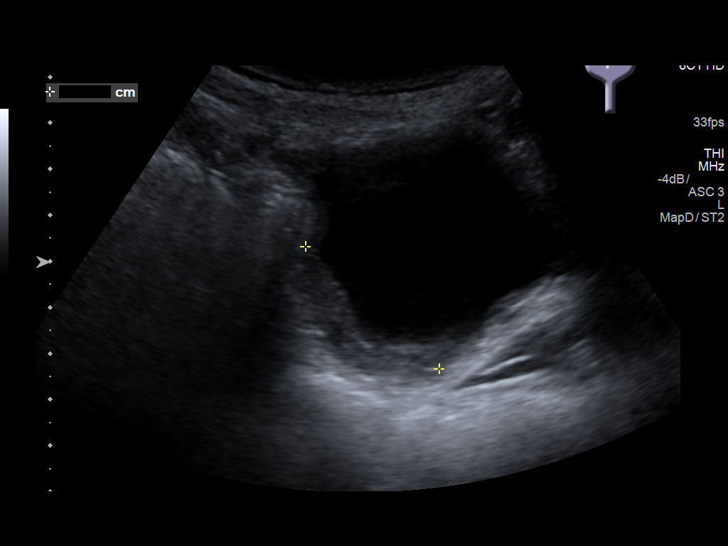
[im 42/67]
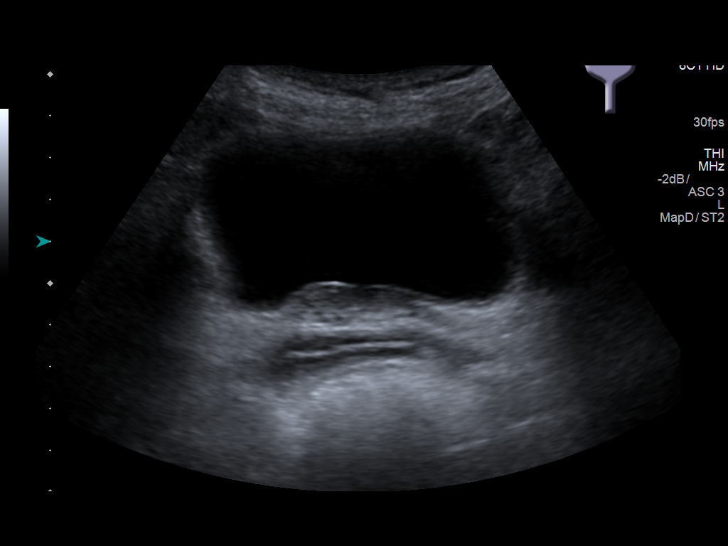
[im 45/67]
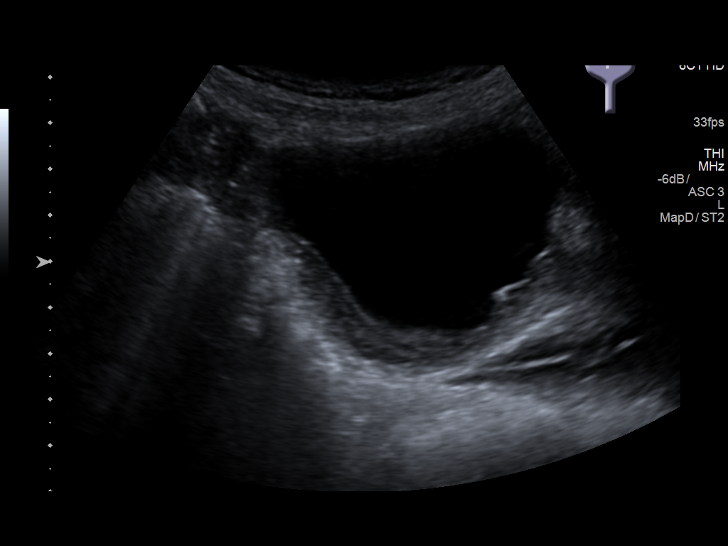
[im 50/67]
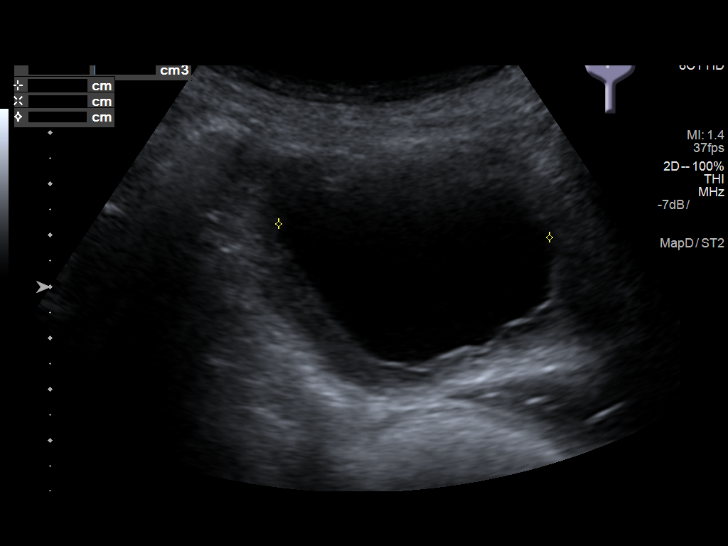
[im 56/67]
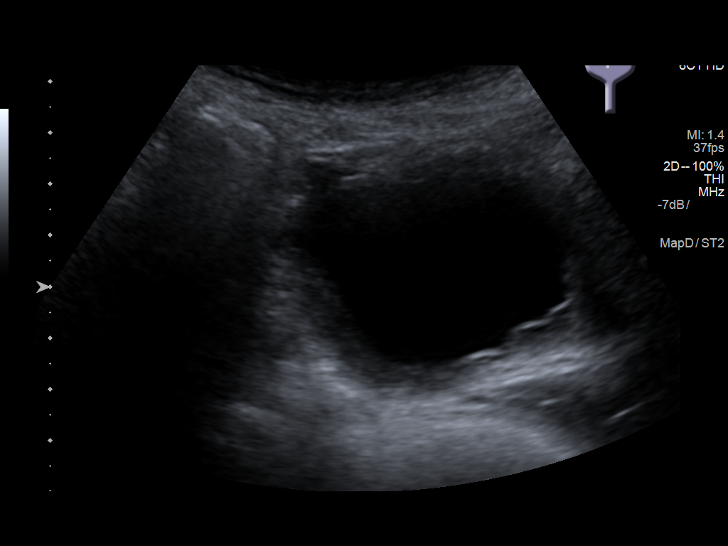
[im 61/67]
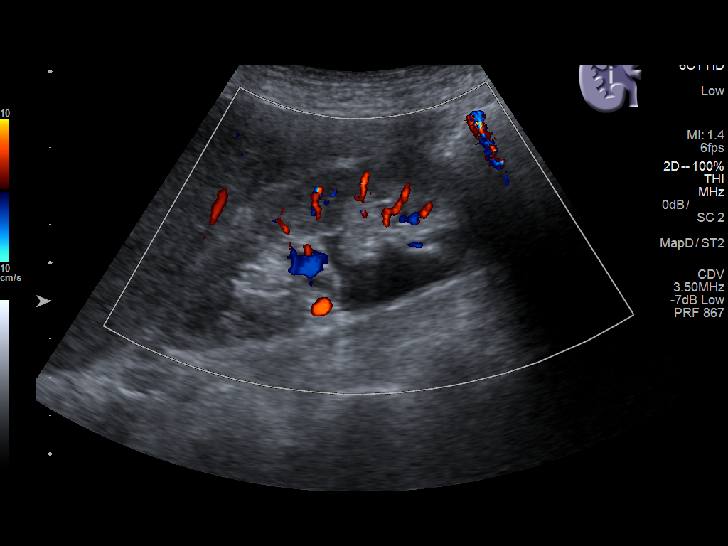
[im 67/67]
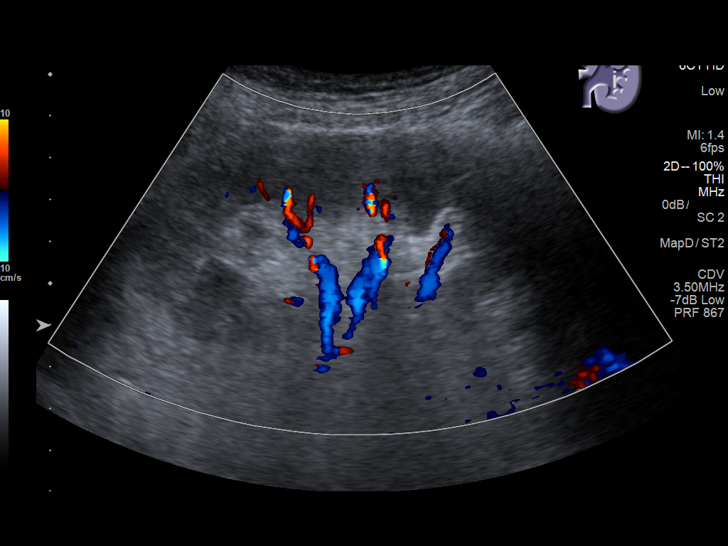

[14 of 25 positions shown; findings below may reference images not displayed]

FINDINGS: Right Kidney:

Length: 10.4 cm. Mild increased parenchymal echogenicity.
Pelvocaliectasis. Lower pole cyst measures 0.7 cm.

Left Kidney:

Length: 10.8 cm. Echogenicity within normal limits. No mass or
hydronephrosis visualized.

Bladder:

The prevoid bladder volume is 98.5 cc. Postvoid bladder volume is
77.3 cc. Debris layering within the dependent portion of the urinary
bladder noted.
IMPRESSION: 1. Right-sided pelvocaliectasis.
2. Mild increased parenchymal echogenicity of the right kidney
suggestive of chronic medical renal disease.

## 2018-08-12 ENCOUNTER — Ambulatory Visit: Payer: Medicare HMO | Admitting: Family Medicine

## 2018-08-17 HISTORY — PX: EYE SURGERY: SHX253

## 2018-08-23 DIAGNOSIS — H2512 Age-related nuclear cataract, left eye: Secondary | ICD-10-CM | POA: Diagnosis not present

## 2018-08-23 DIAGNOSIS — H25812 Combined forms of age-related cataract, left eye: Secondary | ICD-10-CM | POA: Diagnosis not present

## 2018-09-04 ENCOUNTER — Ambulatory Visit
Admission: RE | Admit: 2018-09-04 | Discharge: 2018-09-04 | Disposition: A | Discharge status: Home / Self Care | Payer: Medicare HMO | Source: Ambulatory Visit | Attending: Family Medicine | Admitting: Family Medicine

## 2018-09-04 ENCOUNTER — Other Ambulatory Visit: Payer: Self-pay

## 2018-09-04 DIAGNOSIS — R600 Localized edema: Secondary | ICD-10-CM | POA: Diagnosis not present

## 2018-09-04 DIAGNOSIS — R2242 Localized swelling, mass and lump, left lower limb: Secondary | ICD-10-CM | POA: Diagnosis not present

## 2018-09-04 MED ORDER — GADOBUTROL 1 MMOL/ML IV SOLN
4.0000 mL | Freq: Once | INTRAVENOUS | Status: AC | PRN
  Administered 2018-09-04: 4 mL via INTRAVENOUS

## 2018-09-11 ENCOUNTER — Telehealth: Payer: Self-pay | Admitting: Family Medicine

## 2018-09-11 DIAGNOSIS — R9389 Abnormal findings on diagnostic imaging of other specified body structures: Secondary | ICD-10-CM

## 2018-09-11 NOTE — Telephone Encounter (Signed)
She can decline appointment. Thanks.

## 2018-09-11 NOTE — Telephone Encounter (Signed)
Please let her know that her MRI looks like she tore a ligament in her knee and that's what was causing the lump. There wasn't anything worrying about it. I'm going to get her into see the orthopedist to see what they can do for it. Thanks!

## 2018-09-11 NOTE — Telephone Encounter (Signed)
Copied from [place] [phone]. Topic: General - Call Back - No Documentation >> Sep 09, 2018 11:35 AM Erick Blinks wrote: Reason for CRM: Pt is requesting a call back to discuss recent MRI [phone] -VM

## 2018-09-11 NOTE — Telephone Encounter (Signed)
Patient notified, patient does not want to go to ortho

## 2018-09-11 NOTE — Chronic Care Management (AMB) (Signed)
Chronic Care Management   Note  09/11/2018 Name: Brenda Rocha MRN: [id number] DOB: 11/17/1945  Brenda Rocha is a 73 y.o. year old female who is a primary care patient of Valerie Roys, DO. I reached out to Lilli Light by phone today in response to a referral sent by Brenda Rocha's health plan.    Brenda Rocha was given information about Chronic Care Management services today including:  1. CCM service includes personalized support from designated clinical staff supervised by her physician, including individualized plan of care and coordination with other care providers 2. 24/7 contact phone numbers for assistance for urgent and routine care needs. 3. Service will only be billed when office clinical staff spend 20 minutes or more in a month to coordinate care. 4. Only one practitioner may furnish and bill the service in a calendar month. 5. The patient may stop CCM services at any time (effective at the end of the month) by phone call to the office staff. 6. The patient will be responsible for cost sharing (co-pay) of up to 20% of the service fee (after annual deductible is met).  Patient agreed to services and verbal consent obtained.   Follow up plan: Telephone appointment with CCM team member scheduled for: 09/27/2018  [place]  [email or web address]   ??[phone]

## 2018-09-26 ENCOUNTER — Ambulatory Visit (INDEPENDENT_AMBULATORY_CARE_PROVIDER_SITE_OTHER): Payer: Medicare HMO

## 2018-09-26 VITALS — Ht 61.5 in | Wt 88.0 lb

## 2018-09-26 DIAGNOSIS — Z0000 Encounter for general adult medical examination without abnormal findings: Secondary | ICD-10-CM

## 2018-09-26 NOTE — Progress Notes (Signed)
Subjective:   Brenda Rocha is a 73 y.o. female who presents for Medicare Annual (Subsequent) preventive examination.  Review of Systems:   Cardiac Risk Factors include: advanced age (>51men, >63 women);smoking/ tobacco exposure     Objective:     Vitals: There were no vitals taken for this visit.  There is no height or weight on file to calculate BMI.  Advanced Directives 09/26/2018 09/24/2017 10/07/2016 03/18/2015  Does Patient Have a Medical Advance Directive? Yes Yes Yes Yes  Type of Advance Directive Living will;Healthcare Power of [place];Living will [place] will  Does patient want to make changes to medical advance directive? - - No - Patient declined No - Patient declined  Copy of [place] in Chart? No - copy requested No - copy requested No - copy requested No - copy requested  Would patient like information on creating a medical advance directive? - - No - Patient declined -    Tobacco Social History   Tobacco Use  Smoking Status Current Some Day Smoker  . Packs/day: 0.25  . Types: Cigarettes  Smokeless Tobacco Never Used  Tobacco Comment   1 pack will last a week      Ready to quit: Yes Counseling given: Yes Comment: 1 pack will last a week    Clinical Intake:  Pre-visit preparation completed: Yes  Pain : No/denies pain     Nutritional Risks: None Diabetes: No  How often do you need to have someone help you when you read instructions, pamphlets, or other written materials from your doctor or pharmacy?: 1 - Never  Interpreter Needed?: No  Information entered by :: Kiron Osmun,LPN  Past Medical History:  Diagnosis Date  . Breast cancer, right [facility]) 2003   Bilateral mastectomies.   Marland Kitchen COPD (chronic obstructive pulmonary disease) ([place])   . Neurofibromatosis [facility])    Past Surgical History:  Procedure Laterality Date  . ABDOMINAL HYSTERECTOMY    . BREAST SURGERY     mastectomy  . CHOLECYSTECTOMY    . EYE SURGERY Bilateral 08/2018   Dr.Shah  . MASTECTOMY Bilateral    Family History  Problem Relation Age of Onset  . Cancer Mother        breast  . Heart disease Mother   . Thyroid disease Mother   . Hypertension Mother   . Osteoporosis Mother   . Hypertension Sister   . Hypertension Brother   . Cancer Brother        bladder cancer  . CVA Brother   . Cancer Maternal Grandmother        breast , bone  . Bladder Cancer Neg Hx   . Kidney cancer Neg Hx    Social History   Socioeconomic History  . Marital status: Widowed    Spouse name: Not on file  . Number of children: Not on file  . Years of education: 17 years college   . Highest education level: Associate degree: academic program  Occupational History    Employer: [employer]  Social Needs  . Financial resource strain: Not hard at all  . Food insecurity    Worry: Never true    Inability: Never true  . Transportation needs    Medical: No    Non-medical: No  Tobacco Use  . Smoking status: Current Some Day Smoker    Packs/day: 0.25    Types: Cigarettes  . Smokeless tobacco: Never Used  . Tobacco comment: 1  pack will last a week   Substance and Sexual Activity  . Alcohol use: Yes    Comment: Occasional  . Drug use: No  . Sexual activity: Never  Lifestyle  . Physical activity    Days per week: 0 days    Minutes per session: 0 min  . Stress: Not at all  Relationships  . Social connections    Talks on phone: More than three times a week    Gets together: More than three times a week    Attends religious service: Not on file    Active member of club or organization: Yes    Attends meetings of clubs or organizations: Never    Relationship status: Widowed  Other Topics Concern  . Not on file  Social History Narrative   Working 2-3 times a week at [employer] and [employer] Medications as of 09/26/2018  Medication Sig  . albuterol (PROVENTIL HFA;VENTOLIN HFA)  108 (90 Base) MCG/ACT inhaler Inhale 2 puffs into the lungs every 6 (six) hours as needed for wheezing or shortness of breath.  Marland Kitchen aspirin EC 81 MG tablet Take by mouth.  . calcium carbonate (OS-CAL - DOSED IN MG OF ELEMENTAL CALCIUM) 1250 (500 Ca) MG tablet Take 1 tablet by mouth.  . tiotropium (SPIRIVA HANDIHALER) 18 MCG inhalation capsule Place 1 capsule (18 mcg total) into inhaler and inhale daily.  . vitamin B-12 (CYANOCOBALAMIN) 100 MCG tablet Take 100 mcg by mouth daily.  . Vitamin D, Ergocalciferol, (DRISDOL) 50000 units CAPS capsule Take by mouth.  . vitamin E 100 UNIT capsule Take by mouth daily.  . [DISCONTINUED] sulfamethoxazole-trimethoprim (BACTRIM DS) 800-160 MG tablet Take 1 tablet by mouth 2 (two) times daily. (Patient not taking: Reported on 09/26/2018)   No facility-administered encounter medications on file as of 09/26/2018.     Activities of Daily Living In your present state of health, do you have any difficulty performing the following activities: 09/26/2018  Hearing? N  Comment no hearing aids  Vision? N  Comment goes to Dr.Shah  Difficulty concentrating or making decisions? N  Walking or climbing stairs? N  Dressing or bathing? N  Doing errands, shopping? N  Preparing Food and eating ? N  Using the Toilet? N  In the past six months, have you accidently leaked urine? N  Do you have problems with loss of bowel control? N  Managing your Medications? N  Managing your Finances? N  Housekeeping or managing your Housekeeping? N  Some recent data might be hidden    Patient Care Team: Valerie Roys, DO as PCP - General (Family Medicine) Minor, Dalbert Garnet, RN as [place] Management    Assessment:   This is a routine wellness examination for Brenda Rocha.  Exercise Activities and Dietary recommendations Current Exercise Habits: The patient has a physically strenuous job, but has no regular exercise apart from work., Exercise limited by: None identified   Goals    . Quit Smoking     Smoking cessation discussed        Fall Risk: Fall Risk  09/26/2018 08/06/2018 09/24/2017 09/24/2017 08/10/2016  Falls in the past year? 0 0 No No Yes  Number falls in past yr: - 0 - - 2 or more  Injury with Fall? - 0 - - No  Risk for fall due to : - History of fall(s) - - -  Follow up - Falls evaluation completed - - -    FALL  RISK PREVENTION PERTAINING TO THE HOME:  Any stairs in or around the home? Yes  If so, are there any without handrails? Yes, working on getting them installed - declined assistance   Home free of loose throw rugs in walkways, pet beds, electrical cords, etc? Yes  Adequate lighting in your home to reduce risk of falls? Yes   ASSISTIVE DEVICES UTILIZED TO PREVENT FALLS:  Life alert? No  Use of a cane, walker or w/c? No  Grab bars in the bathroom? No  Shower chair or bench in shower? No  Elevated toilet seat or a handicapped toilet? No   DME ORDERS:  DME order needed?  No   TIMED UP AND GO:  Unable to perform   Depression Screen PHQ 2/9 Scores 09/26/2018 09/24/2017 09/24/2017 08/10/2016  PHQ - 2 Score 0 0 0 0  PHQ- 9 Score - - - 0     Cognitive Function     6CIT Screen 09/24/2017 08/10/2016  What Year? 0 points 0 points  What month? 0 points 0 points  What time? 0 points 0 points  Count back from 20 0 points 0 points  Months in reverse 0 points 0 points  Repeat phrase 0 points 4 points  Total Score 0 4    Immunization History  Administered Date(s) Administered  . Influenza, High Dose Seasonal PF 10/07/2015, 09/24/2017  . Influenza-Unspecified 11/17/2014, 08/24/2016  . Pneumococcal Conjugate-13 09/10/2014  . Pneumococcal Polysaccharide-23 11/28/2012  . Tdap 11/30/2009    Qualifies for Shingles Vaccine? Yes  Zostavax completed n/a. Due for Shingrix. Education has been provided regarding the importance of this vaccine. Pt has been advised to call insurance company to determine out of pocket expense. Advised may also  receive vaccine at local pharmacy or Health Dept. Verbalized acceptance and understanding.  Tdap: up to date   Flu Vaccine: Due for Flu vaccine.   Pneumococcal Vaccine: up to date   Screening Tests Health Maintenance  Topic Date Due  . INFLUENZA VACCINE  08/17/2018  . COLONOSCOPY  07/29/2019 (Originally 11/06/2016)  . TETANUS/TDAP  12/01/2019  . DEXA SCAN  Completed  . Hepatitis C Screening  Completed  . PNA vac Low Risk Adult  Completed    Cancer Screenings:  Colorectal Screening: declined, patient will complete fecal occult- sent by insurance.   Mammogram: no longer required, double mastectomy   Bone Density: Completed 09/07/2016.  Lung Cancer Screening: (Low Dose CT Chest recommended if Age 30-80 years, 30 pack-year currently smoking OR have quit w/in 15years.) does not qualify.    Additional Screening:  Hepatitis C Screening: does qualify; Completed 02/08/2017  Vision Screening: Recommended annual ophthalmology exams for early detection of glaucoma and other disorders of the eye. Is the patient up to date with their annual eye exam?  Yes  Who is the provider or what is the name of the office in which the pt attends annual eye exams? Dr.Shah   Dental Screening: Recommended annual dental exams for proper oral hygiene  Community Resource Referral:  CRR required this visit?  No       Plan:  I have personally reviewed and addressed the Medicare Annual Wellness questionnaire and have noted the following in the patient's chart:  A. Medical and social history B. Use of alcohol, tobacco or illicit drugs  C. Current medications and supplements D. Functional ability and status E.  Nutritional status F.  Physical activity G. Advance directives H. List of other physicians I.  Hospitalizations, surgeries, and ER visits  in previous 12 months J.  [place] such as hearing and vision if needed, cognitive and depression L. Referrals and appointments   In  addition, I have reviewed and discussed with patient certain preventive protocols, quality metrics, and best practice recommendations. A written personalized care plan for preventive services as well as general preventive health recommendations were provided to patient.  Signed,    Bevelyn Ngo, LPN  [phone] Nurse Health Advisor   Nurse Notes:none

## 2018-09-26 NOTE — Patient Instructions (Signed)
Brenda Rocha , Thank you for taking time to come for your Medicare Wellness Visit. I appreciate your ongoing commitment to your health goals. Please review the following plan we discussed and let me know if I can assist you in the future.   Screening recommendations/referrals: Colonoscopy: completing stool card sent by insurance  Mammogram: no longer required Bone Density: completed  Recommended yearly ophthalmology/optometry visit for glaucoma screening and checkup Recommended yearly dental visit for hygiene and checkup  Vaccinations: Influenza vaccine: due now  Pneumococcal vaccine: up to date Tdap vaccine: up to date Shingles vaccine: shingrix eligible     Advanced directives: Please bring a copy of your health care power of attorney and living will to the office at your convenience.  Conditions/risks identified: If you wish to quit smoking, help is available. For free tobacco cessation program offerings call the [facility] at 8195196084 or Live Well Line at (904) [phone]. You may also visit [email or web address] or email [email or web address] for more information on other programs.   Next appointment: follow up in one year for your annual wellness visit   Preventive Care 65 Years and Older, Female Preventive care refers to lifestyle choices and visits with your health care provider that can promote health and wellness. What does preventive care include?  A yearly physical exam. This is also called an annual well check.  Dental exams once or twice a year.  Routine eye exams. Ask your health care provider how often you should have your eyes checked.  Personal lifestyle choices, including:  Daily care of your teeth and gums.  Regular physical activity.  Eating a healthy diet.  Avoiding tobacco and drug use.  Limiting alcohol use.  Practicing safe sex.  Taking low-dose aspirin every day.  Taking vitamin and mineral supplements as recommended by  your health care provider. What happens during an annual well check? The services and screenings done by your health care provider during your annual well check will depend on your age, overall health, lifestyle risk factors, and family history of disease. Counseling  Your health care provider may ask you questions about your:  Alcohol use.  Tobacco use.  Drug use.  Emotional well-being.  Home and relationship well-being.  Sexual activity.  Eating habits.  History of falls.  Memory and ability to understand (cognition).  Work and work Statistician.  Reproductive health. Screening  You may have the following tests or measurements:  Height, weight, and BMI.  Blood pressure.  Lipid and cholesterol levels. These may be checked every 5 years, or more frequently if you are over 53 years old.  Skin check.  Lung cancer screening. You may have this screening every year starting at age 43 if you have a 30-pack-year history of smoking and currently smoke or have quit within the past 15 years.  Fecal occult blood test (FOBT) of the stool. You may have this test every year starting at age 13.  Flexible sigmoidoscopy or colonoscopy. You may have a sigmoidoscopy every 5 years or a colonoscopy every 10 years starting at age 41.  Hepatitis C blood test.  Hepatitis B blood test.  Sexually transmitted disease (STD) testing.  Diabetes screening. This is done by checking your blood sugar (glucose) after you have not eaten for a while (fasting). You may have this done every 1-3 years.  Bone density scan. This is done to screen for osteoporosis. You may have this done starting at age 73.  Mammogram. This may be done every 1-2  years. Talk to your health care provider about how often you should have regular mammograms. Talk with your health care provider about your test results, treatment options, and if necessary, the need for more tests. Vaccines  Your health care provider may  recommend certain vaccines, such as:  Influenza vaccine. This is recommended every year.  Tetanus, diphtheria, and acellular pertussis (Tdap, Td) vaccine. You may need a Td booster every 10 years.  Zoster vaccine. You may need this after age 95.  Pneumococcal 13-valent conjugate (PCV13) vaccine. One dose is recommended after age 74.  Pneumococcal polysaccharide (PPSV23) vaccine. One dose is recommended after age 2. Talk to your health care provider about which screenings and vaccines you need and how often you need them. This information is not intended to replace advice given to you by your health care provider. Make sure you discuss any questions you have with your health care provider. Document Released: 01/29/2015 Document Revised: 09/22/2015 Document Reviewed: 11/03/2014 Elsevier Interactive Patient Education  2017 [place] Prevention in the Home Falls can cause injuries. They can happen to people of all ages. There are many things you can do to make your home safe and to help prevent falls. What can I do on the outside of my home?  Regularly fix the edges of walkways and driveways and fix any cracks.  Remove anything that might make you trip as you walk through a door, such as a raised step or threshold.  Trim any bushes or trees on the path to your home.  Use bright outdoor lighting.  Clear any walking paths of anything that might make someone trip, such as rocks or tools.  Regularly check to see if handrails are loose or broken. Make sure that both sides of any steps have handrails.  Any raised decks and porches should have guardrails on the edges.  Have any leaves, snow, or ice cleared regularly.  Use sand or salt on walking paths during winter.  Clean up any spills in your garage right away. This includes oil or grease spills. What can I do in the bathroom?  Use night lights.  Install grab bars by the toilet and in the tub and shower. Do not use towel  bars as grab bars.  Use non-skid mats or decals in the tub or shower.  If you need to sit down in the shower, use a plastic, non-slip stool.  Keep the floor dry. Clean up any water that spills on the floor as soon as it happens.  Remove soap buildup in the tub or shower regularly.  Attach bath mats securely with double-sided non-slip rug tape.  Do not have throw rugs and other things on the floor that can make you trip. What can I do in the bedroom?  Use night lights.  Make sure that you have a light by your bed that is easy to reach.  Do not use any sheets or blankets that are too big for your bed. They should not hang down onto the floor.  Have a firm chair that has side arms. You can use this for support while you get dressed.  Do not have throw rugs and other things on the floor that can make you trip. What can I do in the kitchen?  Clean up any spills right away.  Avoid walking on wet floors.  Keep items that you use a lot in easy-to-reach places.  If you need to reach something above you, use a strong step  stool that has a grab bar.  Keep electrical cords out of the way.  Do not use floor polish or wax that makes floors slippery. If you must use wax, use non-skid floor wax.  Do not have throw rugs and other things on the floor that can make you trip. What can I do with my stairs?  Do not leave any items on the stairs.  Make sure that there are handrails on both sides of the stairs and use them. Fix handrails that are broken or loose. Make sure that handrails are as long as the stairways.  Check any carpeting to make sure that it is firmly attached to the stairs. Fix any carpet that is loose or worn.  Avoid having throw rugs at the top or bottom of the stairs. If you do have throw rugs, attach them to the floor with carpet tape.  Make sure that you have a light switch at the top of the stairs and the bottom of the stairs. If you do not have them, ask someone to  add them for you. What else can I do to help prevent falls?  Wear shoes that:  Do not have high heels.  Have rubber bottoms.  Are comfortable and fit you well.  Are closed at the toe. Do not wear sandals.  If you use a stepladder:  Make sure that it is fully opened. Do not climb a closed stepladder.  Make sure that both sides of the stepladder are locked into place.  Ask someone to hold it for you, if possible.  Clearly mark and make sure that you can see:  Any grab bars or handrails.  First and last steps.  Where the edge of each step is.  Use tools that help you move around (mobility aids) if they are needed. These include:  Canes.  Walkers.  Scooters.  Crutches.  Turn on the lights when you go into a dark area. Replace any light bulbs as soon as they burn out.  Set up your furniture so you have a clear path. Avoid moving your furniture around.  If any of your floors are uneven, fix them.  If there are any pets around you, be aware of where they are.  Review your medicines with your doctor. Some medicines can make you feel dizzy. This can increase your chance of falling. Ask your doctor what other things that you can do to help prevent falls. This information is not intended to replace advice given to you by your health care provider. Make sure you discuss any questions you have with your health care provider. Document Released: 10/29/2008 Document Revised: 06/10/2015 Document Reviewed: 02/06/2014 Elsevier Interactive Patient Education  2017 [employer].

## 2018-09-27 ENCOUNTER — Encounter: Payer: Self-pay | Admitting: Family Medicine

## 2018-09-27 ENCOUNTER — Other Ambulatory Visit: Payer: Self-pay

## 2018-09-27 ENCOUNTER — Ambulatory Visit (INDEPENDENT_AMBULATORY_CARE_PROVIDER_SITE_OTHER): Payer: Medicare HMO | Admitting: Family Medicine

## 2018-09-27 ENCOUNTER — Ambulatory Visit: Payer: Self-pay | Admitting: *Deleted

## 2018-09-27 ENCOUNTER — Telehealth: Payer: Medicare HMO

## 2018-09-27 VITALS — BP 147/82 | HR 71 | Temp 98.4°F

## 2018-09-27 DIAGNOSIS — J449 Chronic obstructive pulmonary disease, unspecified: Secondary | ICD-10-CM

## 2018-09-27 DIAGNOSIS — M810 Age-related osteoporosis without current pathological fracture: Secondary | ICD-10-CM

## 2018-09-27 DIAGNOSIS — Z1211 Encounter for screening for malignant neoplasm of colon: Secondary | ICD-10-CM

## 2018-09-27 DIAGNOSIS — E782 Mixed hyperlipidemia: Secondary | ICD-10-CM | POA: Diagnosis not present

## 2018-09-27 DIAGNOSIS — Z23 Encounter for immunization: Secondary | ICD-10-CM

## 2018-09-27 DIAGNOSIS — Z0000 Encounter for general adult medical examination without abnormal findings: Secondary | ICD-10-CM

## 2018-09-27 DIAGNOSIS — Z720 Tobacco use: Secondary | ICD-10-CM | POA: Diagnosis not present

## 2018-09-27 LAB — UA/M W/RFLX CULTURE, ROUTINE
Bilirubin, UA: NEGATIVE
Glucose, UA: NEGATIVE
Ketones, UA: NEGATIVE
Leukocytes,UA: NEGATIVE
Nitrite, UA: NEGATIVE
Protein,UA: NEGATIVE
RBC, UA: NEGATIVE
Specific Gravity, UA: 1.025 (ref 1.005–1.030)
Urobilinogen, Ur: 0.2 mg/dL (ref 0.2–1.0)
pH, UA: 5 (ref 5.0–7.5)

## 2018-09-27 MED ORDER — ALBUTEROL SULFATE HFA 108 (90 BASE) MCG/ACT IN AERS: 2.0000 | INHALATION_SPRAY | Freq: Four times a day (QID) | RESPIRATORY_TRACT | 12 refills | Status: AC | PRN

## 2018-09-27 MED ORDER — SPIRIVA HANDIHALER 18 MCG IN CAPS: 18.0000 ug | ORAL_CAPSULE | Freq: Every day | RESPIRATORY_TRACT | 12 refills | Status: DC

## 2018-09-27 NOTE — Chronic Care Management (AMB) (Signed)
  Chronic Care Management   Outreach Note  09/27/2018 Name: Brenda Rocha MRN: [id number] DOB: 05-04-1945  Referred by: Valerie Roys, DO Reason for referral : Chronic Care Management (Initial Outreach attempt)   An unsuccessful telephone outreach was attempted today. The patient was referred to the case management team by for assistance with chronic care management and care coordination.   Follow Up Plan: A HIPPA compliant phone message was left for the patient providing contact information and requesting a return call.  The care management team will reach out to the patient again over the next 30 days.   Merlene Morse Padraig Nhan RN, BSN Nurse Case Editor, commissioning Family Practice/[facility] Care Management  [phone]) Business Mobile

## 2018-09-27 NOTE — Assessment & Plan Note (Signed)
Due for repeat DEXA next year. Declines medication. Continue to monitor.

## 2018-09-27 NOTE — Assessment & Plan Note (Signed)
Stable on current regimen. Refills given today. Call with any concerns. Continue to monitor.

## 2018-09-27 NOTE — Patient Instructions (Signed)
Health Maintenance After Age 74 After age 74, you are at a higher risk for certain long-term diseases and infections as well as injuries from falls. Falls are a major cause of broken bones and head injuries in people who are older than age 74. Getting regular preventive care can help to keep you healthy and well. Preventive care includes getting regular testing and making lifestyle changes as recommended by your health care provider. Talk with your health care provider about:  Which screenings and tests you should have. A screening is a test that checks for a disease when you have no symptoms.  A diet and exercise plan that is right for you. What should I know about screenings and tests to prevent falls? Screening and testing are the best ways to find a health problem early. Early diagnosis and treatment give you the best chance of managing medical conditions that are common after age 74. Certain conditions and lifestyle choices may make you more likely to have a fall. Your health care provider may recommend:  Regular vision checks. Poor vision and conditions such as cataracts can make you more likely to have a fall. If you wear glasses, make sure to get your prescription updated if your vision changes.  Medicine review. Work with your health care provider to regularly review all of the medicines you are taking, including over-the-counter medicines. Ask your health care provider about any side effects that may make you more likely to have a fall. Tell your health care provider if any medicines that you take make you feel dizzy or sleepy.  Osteoporosis screening. Osteoporosis is a condition that causes the bones to get weaker. This can make the bones weak and cause them to break more easily.  Blood pressure screening. Blood pressure changes and medicines to control blood pressure can make you feel dizzy.  Strength and balance checks. Your health care provider may recommend certain tests to check your  strength and balance while standing, walking, or changing positions.  Foot health exam. Foot pain and numbness, as well as not wearing proper footwear, can make you more likely to have a fall.  Depression screening. You may be more likely to have a fall if you have a fear of falling, feel emotionally low, or feel unable to do activities that you used to do.  Alcohol use screening. Using too much alcohol can affect your balance and may make you more likely to have a fall. What actions can I take to lower my risk of falls? General instructions  Talk with your health care provider about your risks for falling. Tell your health care provider if: ? You fall. Be sure to tell your health care provider about all falls, even ones that seem minor. ? You feel dizzy, sleepy, or off-balance.  Take over-the-counter and prescription medicines only as told by your health care provider. These include any supplements.  Eat a healthy diet and maintain a healthy weight. A healthy diet includes low-fat dairy products, low-fat (lean) meats, and fiber from whole grains, beans, and lots of fruits and vegetables. Home safety  Remove any tripping hazards, such as rugs, cords, and clutter.  Install safety equipment such as grab bars in bathrooms and safety rails on stairs.  Keep rooms and walkways well-lit. Activity   Follow a regular exercise program to stay fit. This will help you maintain your balance. Ask your health care provider what types of exercise are appropriate for you.  If you need a cane or   walker, use it as recommended by your health care provider.  Wear supportive shoes that have nonskid soles. Lifestyle  Do not drink alcohol if your health care provider tells you not to drink.  If you drink alcohol, limit how much you have: ? 0-1 drink a day for women. ? 0-2 drinks a day for men.  Be aware of how much alcohol is in your drink. In the [place] one drink equals one typical bottle of beer (12  oz), one-half glass of wine (5 oz), or one shot of hard liquor (1 oz).  Do not use any products that contain nicotine or tobacco, such as cigarettes and e-cigarettes. If you need help quitting, ask your health care provider. Summary  Having a healthy lifestyle and getting preventive care can help to protect your health and wellness after age 87.  Screening and testing are the best way to find a health problem early and help you avoid having a fall. Early diagnosis and treatment give you the best chance for managing medical conditions that are more common for people who are older than age 39.  Falls are a major cause of broken bones and head injuries in people who are older than age 61. Take precautions to prevent a fall at home.  Work with your health care provider to learn what changes you can make to improve your health and wellness and to prevent falls. This information is not intended to replace advice given to you by your health care provider. Make sure you discuss any questions you have with your health care provider. Document Released: 11/15/2016 Document Revised: 04/25/2018 Document Reviewed: 11/15/2016 Elsevier Patient Education  [employer]. Influenza (Flu) Vaccine (Inactivated or Recombinant): What You Need to Know 1. Why get vaccinated? Influenza vaccine can prevent influenza (flu). Flu is a contagious disease that spreads around the [place] every year, usually between October and May. Anyone can get the flu, but it is more dangerous for some people. Infants and young children, people 48 years of age and older, pregnant women, and people with certain health conditions or a weakened immune system are at greatest risk of flu complications. Pneumonia, bronchitis, sinus infections and ear infections are examples of flu-related complications. If you have a medical condition, such as heart disease, cancer or diabetes, flu can make it worse. Flu can cause fever and chills, sore  throat, muscle aches, fatigue, cough, headache, and runny or stuffy nose. Some people may have vomiting and diarrhea, though this is more common in children than adults. Each year thousands of people in the [place] die from flu, and many more are hospitalized. Flu vaccine prevents millions of illnesses and flu-related visits to the doctor each year. 2. Influenza vaccine CDC recommends everyone 17 months of age and older get vaccinated every flu season. Children 6 months through 57 years of age may need 2 doses during a single flu season. Everyone else needs only 1 dose each flu season. It takes about 2 weeks for protection to develop after vaccination. There are many flu viruses, and they are always changing. Each year a new flu vaccine is made to protect against three or four viruses that are likely to cause disease in the upcoming flu season. Even when the vaccine doesn't exactly match these viruses, it may still provide some protection. Influenza vaccine does not cause flu. Influenza vaccine may be given at the same time as other vaccines. 3. Talk with your health care provider Tell your vaccine provider  if the person getting the vaccine:  Has had an allergic reaction after a previous dose of influenza vaccine, or has any severe, life-threatening allergies.  Has ever had Guillain-Barr Syndrome (also called GBS). In some cases, your health care provider may decide to postpone influenza vaccination to a future visit. People with minor illnesses, such as a cold, may be vaccinated. People who are moderately or severely ill should usually wait until they recover before getting influenza vaccine. Your health care provider can give you more information. 4. Risks of a vaccine reaction  Soreness, redness, and swelling where shot is given, fever, muscle aches, and headache can happen after influenza vaccine.  There may be a very small increased risk of Guillain-Barr Syndrome (GBS) after  inactivated influenza vaccine (the flu shot). Young children who get the flu shot along with pneumococcal vaccine (PCV13), and/or DTaP vaccine at the same time might be slightly more likely to have a seizure caused by fever. Tell your health care provider if a child who is getting flu vaccine has ever had a seizure. People sometimes faint after medical procedures, including vaccination. Tell your provider if you feel dizzy or have vision changes or ringing in the ears. As with any medicine, there is a very remote chance of a vaccine causing a severe allergic reaction, other serious injury, or death. 5. What if there is a serious problem? An allergic reaction could occur after the vaccinated person leaves the clinic. If you see signs of a severe allergic reaction (hives, swelling of the face and throat, difficulty breathing, a fast heartbeat, dizziness, or weakness), call 9-1-1 and get the person to the nearest hospital. For other signs that concern you, call your health care provider. Adverse reactions should be reported to the Vaccine Adverse Event Reporting System (VAERS). Your health care provider will usually file this report, or you can do it yourself. Visit the VAERS website at www.vaers.SamedayNews.es or call 817-168-5653.VAERS is only for reporting reactions, and VAERS staff do not give medical advice. 6. The National Vaccine Injury Compensation Program The [employer] Vaccine Injury Compensation Program (VICP) is a federal program that was created to compensate people who may have been injured by certain vaccines. Visit the VICP website at GoldCloset.com.ee or call 779-101-4337 to learn about the program and about filing a claim. There is a time limit to file a claim for compensation. 7. How can I learn more?  Ask your healthcare provider.  Call your local or state health department.  Contact the Centers for Disease Control and Prevention (CDC): ? Call (640) 046-4242  (1-800-CDC-INFO) or ? Visit CDC's https://gibson.com/ Vaccine Information Statement (Interim) Inactivated Influenza Vaccine (08/30/2017) This information is not intended to replace advice given to you by your health care provider. Make sure you discuss any questions you have with your health care provider. Document Released: 10/27/2005 Document Revised: 04/23/2018 Document Reviewed: 09/03/2017 Elsevier Patient Education  2020 [employer].

## 2018-09-27 NOTE — Assessment & Plan Note (Signed)
Rechecking levels today. Await results. Call with any concerns.  

## 2018-09-27 NOTE — Assessment & Plan Note (Signed)
Not interested in quitting. She will let us know if she changes her mind

## 2018-09-27 NOTE — Progress Notes (Signed)
BP (!) 160/92   Pulse 71   Temp 98.4 F (36.9 C)   SpO2 94%    Subjective:    Patient ID: Brenda Rocha, female    DOB: 22-Jun-1945, 73 y.o.   MRN: [id number]  HPI: Brenda Rocha is a 73 y.o. female presenting on 09/27/2018 for comprehensive medical examination. Current medical complaints include:  COPD COPD status: controlled Satisfied with current treatment?: yes Oxygen use: no Dyspnea frequency: never Cough frequency: occasionally Rescue inhaler frequency:  never Limitation of activity: no Productive cough: none Pneumovax: Up to Date Influenza: Given today  HYPERLIPIDEMIA Hyperlipidemia status: stable Satisfied with current treatment?  yes Side effects:  no Medication compliance: not on anything Past cholesterol meds: none Supplements: none Aspirin:  yes The 10-year ASCVD risk score Mikey Bussing DC Jr., et al., 2013) is: 28.3%   Values used to calculate the score:     Age: 38 years     Sex: Female     Is Non-Hispanic African American: No     Diabetic: No     Tobacco smoker: Yes     Systolic Blood Pressure: 0000000 mmHg     Is BP treated: No     HDL Cholesterol: 64 mg/dL     Total Cholesterol: 211 mg/dL Chest pain:  no Coronary artery disease:  no  OSTEOPOROSIS Satisfied with current treatment?: yes Medication side effects: only on calcium and vitamin D Medication compliance: good compliance Past osteoporosis medications/treatments:  calcium and vitamin D Adequate calcium & vitamin D: yes Intolerance to bisphosphonates:no Weight bearing exercises: yes  Menopausal Symptoms: no  Depression Screen done today and results listed below:  Depression screen [facility] 2/9 09/26/2018 09/24/2017 09/24/2017 08/10/2016 03/18/2015  Decreased Interest 0 0 0 0 0  Down, Depressed, Hopeless 0 0 0 0 0  PHQ - 2 Score 0 0 0 0 0  Altered sleeping - - - 0 -  Tired, decreased energy - - - 0 -  Change in appetite - - - 0 -  Feeling bad or failure about yourself  - - - 0 -  Trouble  concentrating - - - 0 -  Moving slowly or fidgety/restless - - - 0 -  Suicidal thoughts - - - 0 -  PHQ-9 Score - - - 0 -    Past Medical History:  Past Medical History:  Diagnosis Date  . Breast cancer, right [facility]) 2003   Bilateral mastectomies.   Marland Kitchen COPD (chronic obstructive pulmonary disease) ([place])   . Neurofibromatosis [facility])     Surgical History:  Past Surgical History:  Procedure Laterality Date  . ABDOMINAL HYSTERECTOMY    . BREAST SURGERY     mastectomy  . CHOLECYSTECTOMY    . EYE SURGERY Bilateral 08/2018   Dr.Shah  . MASTECTOMY Bilateral     Medications:  Current Outpatient Medications on File Prior to Visit  Medication Sig  . aspirin EC 81 MG tablet Take by mouth.  . calcium carbonate (OS-CAL - DOSED IN MG OF ELEMENTAL CALCIUM) 1250 (500 Ca) MG tablet Take 1 tablet by mouth.  . vitamin B-12 (CYANOCOBALAMIN) 100 MCG tablet Take 100 mcg by mouth daily.  . Vitamin D, Ergocalciferol, (DRISDOL) 50000 units CAPS capsule Take by mouth.  . vitamin E 100 UNIT capsule Take by mouth daily.   No current facility-administered medications on file prior to visit.     Allergies:  No Known Allergies  Social History:  Social History   Socioeconomic History  . Marital status:  Widowed    Spouse name: Not on file  . Number of children: Not on file  . Years of education: 53 years college   . Highest education level: Associate degree: academic program  Occupational History    Employer: [employer]  Social Needs  . Financial resource strain: Not hard at all  . Food insecurity    Worry: Never true    Inability: Never true  . Transportation needs    Medical: No    Non-medical: No  Tobacco Use  . Smoking status: Current Some Day Smoker    Packs/day: 0.25    Types: Cigarettes  . Smokeless tobacco: Never Used  . Tobacco comment: 1 pack will last a week   Substance and Sexual Activity  . Alcohol use: Yes    Comment: Occasional  . Drug use: No  . Sexual activity: Never   Lifestyle  . Physical activity    Days per week: 0 days    Minutes per session: 0 min  . Stress: Not at all  Relationships  . Social connections    Talks on phone: More than three times a week    Gets together: More than three times a week    Attends religious service: Not on file    Active member of club or organization: Yes    Attends meetings of clubs or organizations: Never    Relationship status: Widowed  . Intimate partner violence    Fear of current or ex partner: No    Emotionally abused: No    Physically abused: No    Forced sexual activity: No  Other Topics Concern  . Not on file  Social History Narrative   Working 2-3 times a week at [employer] and [place] History   Tobacco Use  Smoking Status Current Some Day Smoker  . Packs/day: 0.25  . Types: Cigarettes  Smokeless Tobacco Never Used  Tobacco Comment   1 pack will last a week    Social History   Substance and Sexual Activity  Alcohol Use Yes   Comment: Occasional    Family History:  Family History  Problem Relation Age of Onset  . Cancer Mother        breast  . Heart disease Mother   . Thyroid disease Mother   . Hypertension Mother   . Osteoporosis Mother   . Hypertension Sister   . Hypertension Brother   . Cancer Brother        bladder cancer  . CVA Brother   . Cancer Maternal Grandmother        breast , bone  . Bladder Cancer Neg Hx   . Kidney cancer Neg Hx     Past medical history, surgical history, medications, allergies, family history and social history reviewed with patient today and changes made to appropriate areas of the chart.   Review of Systems  Constitutional: Negative.   HENT: Negative.   Eyes: Negative.   Respiratory: Negative.   Cardiovascular: Negative.   Gastrointestinal: Negative.   Genitourinary: Negative.   Musculoskeletal: Negative.   Skin: Negative.   Neurological: Negative.   Endo/Heme/Allergies: Positive for environmental allergies. Negative for  polydipsia. Does not bruise/bleed easily.  Psychiatric/Behavioral: Negative.     All other ROS negative except what is listed above and in the HPI.      Objective:    BP (!) 160/92   Pulse 71   Temp 98.4 F (36.9 C)   SpO2 94%   Wt  Readings from Last 3 Encounters:  09/26/18 88 lb (39.9 kg)  07/29/18 88 lb (39.9 kg)  03/28/18 88 lb (39.9 kg)    Physical Exam Vitals signs and nursing note reviewed.  Constitutional:      General: She is not in acute distress.    Appearance: Normal appearance. She is cachectic. She is not ill-appearing, toxic-appearing or diaphoretic.  HENT:     Head: Normocephalic and atraumatic.     Right Ear: Tympanic membrane, ear canal and external ear normal. There is no impacted cerumen.     Left Ear: Tympanic membrane, ear canal and external ear normal. There is no impacted cerumen.     Nose: Nose normal. No congestion or rhinorrhea.     Mouth/Throat:     Mouth: Mucous membranes are moist.     Pharynx: Oropharynx is clear. No oropharyngeal exudate or posterior oropharyngeal erythema.  Eyes:     General: No scleral icterus.       Right eye: No discharge.        Left eye: No discharge.     Extraocular Movements: Extraocular movements intact.     Conjunctiva/sclera: Conjunctivae normal.     Pupils: Pupils are equal, round, and reactive to Rocha.  Neck:     Musculoskeletal: Normal range of motion and neck supple. No neck rigidity or muscular tenderness.     Vascular: No carotid bruit.  Cardiovascular:     Rate and Rhythm: Normal rate and regular rhythm.     Pulses: Normal pulses.     Heart sounds: No murmur. No friction rub. No gallop.   Pulmonary:     Effort: Pulmonary effort is normal. No respiratory distress.     Breath sounds: Normal breath sounds. No stridor. No wheezing, rhonchi or rales.  Chest:     Chest wall: No tenderness.  Abdominal:     General: Abdomen is flat. Bowel sounds are normal. There is no distension.     Palpations: Abdomen  is soft. There is no mass.     Tenderness: There is no abdominal tenderness. There is no right CVA tenderness, left CVA tenderness, guarding or rebound.     Hernia: No hernia is present.  Genitourinary:    Comments: Breast and pelvic exams deferred with shared decision making Musculoskeletal:        General: No swelling, tenderness, deformity or signs of injury.     Right lower leg: No edema.     Left lower leg: No edema.  Lymphadenopathy:     Cervical: No cervical adenopathy.  Skin:    General: Skin is warm and dry.     Capillary Refill: Capillary refill takes less than 2 seconds.     Coloration: Skin is not jaundiced or pale.     Findings: No bruising, erythema, lesion or rash.  Neurological:     General: No focal deficit present.     Mental Status: She is alert and oriented to person, place, and time. Mental status is at baseline.     Cranial Nerves: No cranial nerve deficit.     Sensory: No sensory deficit.     Motor: No weakness.     Coordination: Coordination normal.     Gait: Gait normal.     Deep Tendon Reflexes: Reflexes normal.  Psychiatric:        Mood and Affect: Mood normal.        Behavior: Behavior normal.        Thought Content: Thought content normal.  Judgment: Judgment normal.     Results for orders placed or performed in visit on 03/28/18  Microscopic Examination   URINE  Result Value Ref Range   WBC, UA 0-5 0 - 5 /hpf   RBC, UA 0-2 0 - 2 /hpf   Epithelial Cells (non renal) >10 (A) 0 - 10 /hpf   Bacteria, UA Few None seen/Few  Urinalysis, Complete  Result Value Ref Range   Specific Gravity, UA 1.025 1.005 - 1.030   pH, UA 6.0 5.0 - 7.5   Color, UA Yellow Yellow   Appearance Ur Clear Clear   Leukocytes, UA Negative Negative   Protein, UA Negative Negative/Trace   Glucose, UA Negative Negative   Ketones, UA Negative Negative   RBC, UA Trace (A) Negative   Bilirubin, UA Negative Negative   Urobilinogen, Ur 0.2 0.2 - 1.0 mg/dL   Nitrite, UA  Negative Negative   Microscopic Examination See below:       Assessment & Plan:   Problem List Items Addressed This Visit      Respiratory   COLD (chronic obstructive lung disease) (HCC)    Stable on current regimen. Refills given today. Call with any concerns. Continue to monitor.       Relevant Medications   tiotropium (SPIRIVA HANDIHALER) 18 MCG inhalation capsule   albuterol (VENTOLIN HFA) 108 (90 Base) MCG/ACT inhaler   Other Relevant Orders   CBC with Differential/Platelet   Comprehensive metabolic panel     Musculoskeletal and Integument   Osteoporosis    Due for repeat DEXA next year. Declines medication. Continue to monitor.       Relevant Orders   TSH   VITAMIN D 25 Hydroxy (Vit-D Deficiency, Fractures)     Other   Hyperlipidemia    Rechecking levels today. Await results. Call with any concerns.       Relevant Orders   Comprehensive metabolic panel   Lipid Panel w/o Chol/HDL Ratio   Tobacco abuse    Not interested in quitting. She will let us know if she changes her mind       Other Visit Diagnoses    Routine general medical examination at a health care facility    -  Primary   Vaccines up to date. Screening labs checked today. Cologuard ordered. Call with any concerns.    Relevant Orders   UA/M w/rflx Culture, Routine   Immunization due       Flu shot given today.    Relevant Orders   Flu Vaccine QUAD High Dose(Fluad) (Completed)   Screening for colon cancer       Cologuard ordered today.    Relevant Orders   Cologuard       Follow up plan: Return in about 6 months (around 03/27/2019) for Follow up .   LABORATORY TESTING:  - Pap smear: not applicable  IMMUNIZATIONS:   - Tdap: Tetanus vaccination status reviewed: last tetanus booster within 10 years. - Influenza: Administered today - Pneumovax: Up to date - Prevnar: Up to date  SCREENING: -Mammogram: Not applicable  - Colonoscopy: cologuard ordered today  - Bone Density: Up to date    PATIENT COUNSELING:   Advised to take 1 mg of folate supplement per day if capable of pregnancy.   Sexuality: Discussed sexually transmitted diseases, partner selection, use of condoms, avoidance of unintended pregnancy  and contraceptive alternatives.   Advised to avoid cigarette smoking.  I discussed with the patient that most people either abstain from alcohol or  drink within safe limits (<=14/week and <=4 drinks/occasion for males, <=7/weeks and <= 3 drinks/occasion for females) and that the risk for alcohol disorders and other health effects rises proportionally with the number of drinks per week and how often a drinker exceeds daily limits.  Discussed cessation/primary prevention of drug use and availability of treatment for abuse.   Diet: Encouraged to adjust caloric intake to maintain  or achieve ideal body weight, to reduce intake of dietary saturated fat and total fat, to limit sodium intake by avoiding high sodium foods and not adding table salt, and to maintain adequate dietary potassium and calcium preferably from fresh fruits, vegetables, and low-fat dairy products.    stressed the importance of regular exercise  Injury prevention: Discussed safety belts, safety helmets, smoke detector, smoking near bedding or upholstery.   Dental health: Discussed importance of regular tooth brushing, flossing, and dental visits.    NEXT PREVENTATIVE PHYSICAL DUE IN 1 YEAR. Return in about 6 months (around 03/27/2019) for Follow up .

## 2018-09-28 LAB — COMPREHENSIVE METABOLIC PANEL
ALT: 16 IU/L (ref 0–32)
AST: 19 IU/L (ref 0–40)
Albumin/Globulin Ratio: 1.7 (ref 1.2–2.2)
Albumin: 4.3 g/dL (ref 3.7–4.7)
Alkaline Phosphatase: 101 IU/L (ref 39–117)
BUN/Creatinine Ratio: 17 (ref 12–28)
BUN: 11 mg/dL (ref 8–27)
Bilirubin Total: 0.5 mg/dL (ref 0.0–1.2)
CO2: 21 mmol/L (ref 20–29)
Calcium: 9.1 mg/dL (ref 8.7–10.3)
Chloride: 103 mmol/L (ref 96–106)
Creatinine, Ser: 0.63 mg/dL (ref 0.57–1.00)
GFR calc Af Amer: 103 mL/min/{1.73_m2} (ref >59)
GFR calc non Af Amer: 89 mL/min/{1.73_m2} (ref >59)
Globulin, Total: 2.6 g/dL (ref 1.5–4.5)
Glucose: 79 mg/dL (ref 65–99)
Potassium: 4.8 mmol/L (ref 3.5–5.2)
Sodium: 142 mmol/L (ref 134–144)
Total Protein: 6.9 g/dL (ref 6.0–8.5)

## 2018-09-28 LAB — CBC WITH DIFFERENTIAL/PLATELET
Basophils Absolute: 0.1 10*3/uL (ref 0.0–0.2)
Basos: 1 %
EOS (ABSOLUTE): 0.1 10*3/uL (ref 0.0–0.4)
Eos: 2 %
Hematocrit: 45.6 % (ref 34.0–46.6)
Hemoglobin: 15.3 g/dL (ref 11.1–15.9)
Immature Grans (Abs): 0 10*3/uL (ref 0.0–0.1)
Immature Granulocytes: 1 %
Lymphocytes Absolute: 1.3 10*3/uL (ref 0.7–3.1)
Lymphs: 21 %
MCH: 29.5 pg (ref 26.6–33.0)
MCHC: 33.6 g/dL (ref 31.5–35.7)
MCV: 88 fL (ref 79–97)
Monocytes Absolute: 0.6 10*3/uL (ref 0.1–0.9)
Monocytes: 9 %
Neutrophils Absolute: 4.3 10*3/uL (ref 1.4–7.0)
Neutrophils: 66 %
Platelets: 286 10*3/uL (ref 150–450)
RBC: 5.19 x10E6/uL (ref 3.77–5.28)
RDW: 13.6 % (ref 11.7–15.4)
WBC: 6.4 10*3/uL (ref 3.4–10.8)

## 2018-09-28 LAB — LIPID PANEL W/O CHOL/HDL RATIO
Cholesterol, Total: 225 mg/dL — ABNORMAL HIGH (ref 100–199)
HDL: 67 mg/dL (ref >39)
LDL Chol Calc (NIH): 136 mg/dL — ABNORMAL HIGH (ref 0–99)
Triglycerides: 123 mg/dL (ref 0–149)
VLDL Cholesterol Cal: 22 mg/dL (ref 5–40)

## 2018-09-28 LAB — TSH: TSH: 1.43 u[IU]/mL (ref 0.450–4.500)

## 2018-09-28 LAB — VITAMIN D 25 HYDROXY (VIT D DEFICIENCY, FRACTURES): Vit D, 25-Hydroxy: 13.6 ng/mL — ABNORMAL LOW (ref 30.0–100.0)

## 2018-09-29 ENCOUNTER — Encounter: Payer: Self-pay | Admitting: Family Medicine

## 2018-09-29 DIAGNOSIS — R339 Retention of urine, unspecified: Secondary | ICD-10-CM

## 2018-09-29 MED ORDER — VITAMIN D (ERGOCALCIFEROL) 1.25 MG (50000 UNIT) PO CAPS: 50000.0000 [IU] | ORAL_CAPSULE | ORAL | 0 refills | Status: DC

## 2018-10-09 ENCOUNTER — Other Ambulatory Visit: Payer: Self-pay | Admitting: Family Medicine

## 2018-10-30 ENCOUNTER — Telehealth: Payer: Self-pay

## 2018-12-10 ENCOUNTER — Telehealth: Payer: Self-pay

## 2018-12-17 ENCOUNTER — Other Ambulatory Visit: Payer: Self-pay | Admitting: Family Medicine

## 2018-12-17 DIAGNOSIS — R339 Retention of urine, unspecified: Secondary | ICD-10-CM

## 2018-12-17 NOTE — Telephone Encounter (Signed)
Requested medication (s) are due for refill today: yes  Requested medication (s) are on the active medication list: yes  Last refill:  09/29/2018  Future visit scheduled: yes  Notes to clinic:  Vitamin D supplementation failed.   Requested Prescriptions  Pending Prescriptions Disp Refills   Vitamin D, Ergocalciferol, (DRISDOL) 1.25 MG (50000 UT) CAPS capsule [Pharmacy Med Name: VITAMIN D2 50,000IU (ERGO) CAP RX] 12 capsule 0    Sig: TAKE 1 CAPSULE BY MOUTH EVERY 7 DAYS     Endocrinology:  Vitamins - Vitamin D Supplementation Failed - 12/17/2018  4:19 PM      Failed - 50,000 IU strengths are not delegated      Failed - Phosphate in normal range and within 360 days    No results found for: PHOS       Failed - Vitamin D in normal range and within 360 days    Vit D, 25-Hydroxy  Date Value Ref Range Status  09/27/2018 13.6 (L) 30.0 - 100.0 ng/mL Final    Comment:    Vitamin D deficiency has been defined by the Institute of Medicine and an Endocrine Society practice guideline as a level of serum 25-OH vitamin D less than 20 ng/mL (1,2). The Endocrine Society went on to further define vitamin D insufficiency as a level between 21 and 29 ng/mL (2). 1. IOM (Institute of Medicine). 2010. Dietary reference    intakes for calcium and D. [place]: The    [employer]. 2. Holick MF, Binkley [place], Bischoff-Ferrari HA, et al.    Evaluation, treatment, and prevention of vitamin D    deficiency: an Endocrine Society clinical practice    guideline. JCEM. 2011 Jul; 96(7):1911-30.          Passed - Ca in normal range and within 360 days    Calcium  Date Value Ref Range Status  09/27/2018 9.1 8.7 - 10.3 mg/dL Final   Calcium, Total  Date Value Ref Range Status  06/21/2011 8.4 (L) 8.5 - 10.1 mg/dL Final         Passed - Valid encounter within last 12 months    Recent Outpatient Visits          2 months ago Routine general medical examination at a health care facility   [facility], [place] P, DO   4 months ago [place], [place], DO   4 months ago Cellulitis of left leg   [place], [place], DO   4 months ago Cellulitis of left leg   [place], [place], DO   1 year ago Routine general medical examination at a health care facility   [place], Barb Merino, DO      Future Appointments            In 3 months Wynetta Emery, Barb Merino, DO [employer], [place]   In 3 months [facility] [place], Herbert Seta, [place]

## 2019-02-12 ENCOUNTER — Telehealth: Payer: Self-pay

## 2019-03-13 DIAGNOSIS — H353132 Nonexudative age-related macular degeneration, bilateral, intermediate dry stage: Secondary | ICD-10-CM | POA: Diagnosis not present

## 2019-03-16 ENCOUNTER — Other Ambulatory Visit: Payer: Self-pay | Admitting: Family Medicine

## 2019-03-16 DIAGNOSIS — R339 Retention of urine, unspecified: Secondary | ICD-10-CM

## 2019-03-17 NOTE — Telephone Encounter (Signed)
Pt is requesting call back to discuss refill she needs. Please advise  Best contact: [phone]

## 2019-03-17 NOTE — Telephone Encounter (Signed)
Returned call to patient. LVM for patient to return call to the office.

## 2019-03-17 NOTE — Telephone Encounter (Signed)
LOV 09/27/18

## 2019-03-18 NOTE — Telephone Encounter (Signed)
Yes- go ahead and do 2000IU daily

## 2019-03-18 NOTE — Telephone Encounter (Signed)
Reviewed RX in chart and patient has finished prescription vitamin D. Is she supposed to start taking OTC vitamin D daily now?

## 2019-03-18 NOTE — Telephone Encounter (Signed)
Pt aware to take 2000UI daily Vitamin D OTC.

## 2019-03-21 ENCOUNTER — Telehealth: Payer: Self-pay | Admitting: General Practice

## 2019-03-21 ENCOUNTER — Ambulatory Visit (INDEPENDENT_AMBULATORY_CARE_PROVIDER_SITE_OTHER): Payer: Medicare HMO | Admitting: General Practice

## 2019-03-21 DIAGNOSIS — E782 Mixed hyperlipidemia: Secondary | ICD-10-CM

## 2019-03-21 DIAGNOSIS — J449 Chronic obstructive pulmonary disease, unspecified: Secondary | ICD-10-CM | POA: Diagnosis not present

## 2019-03-21 NOTE — Patient Instructions (Signed)
Visit Information  Goals Addressed            This Visit's Progress    RNCM: I am watching what I eat and I do check my blood pressure at home       [place] (see longtitudinal plan of care for additional care plan information)  Current Barriers:   Chronic Disease Management support, education, and care coordination needs related to HLD and COPD  Clinical Goal(s) related to HLD and COPD:  Over the next 120 days, patient will:   Work with the care management team to address educational, disease management, and care coordination needs   Begin or continue self health monitoring activities as directed today Measure and record blood pressure 4 times per week and adhere to a Heart healthy diet  Call provider office for new or worsened signs and symptoms Blood pressure findings outside established parameters, New or worsened symptom related to HLD and COPD  Call care management team with questions or concerns  Verbalize basic understanding of patient centered plan of care established today   Interventions related to HLD and COPD:   Evaluation of current treatment plans and patient's adherence to plan as established by provider  Assessed patient understanding of disease states  Assessed patient's education and care coordination needs  Provided disease specific education to patient   Collaborated with appropriate clinical care team members regarding patient needs  Patient Self Care Activities related to HLD and COPD:   Patient is unable to independently self-manage chronic health conditions  Initial goal documentation        Ms. Scarfone was given information about Chronic Care Management services today including:  1. CCM service includes personalized support from designated clinical staff supervised by her physician, including individualized plan of care and coordination with other care providers 2. 24/7 contact phone numbers for assistance for urgent and routine  care needs. 3. Service will only be billed when office clinical staff spend 20 minutes or more in a month to coordinate care. 4. Only one practitioner may furnish and bill the service in a calendar month. 5. The patient may stop CCM services at any time (effective at the end of the month) by phone call to the office staff. 6. The patient will be responsible for cost sharing (co-pay) of up to 20% of the service fee (after annual deductible is met).  Patient agreed to services and verbal consent obtained.   Patient verbalizes understanding of instructions provided today.   The care management team will reach out to the patient again over the next 60 days.   Noreene Larsson RN, MSN, [facility] Mobile: [phone]

## 2019-03-21 NOTE — Chronic Care Management (AMB) (Signed)
Chronic Care Management   Initial Visit Note  03/21/2019 Name: Brenda Rocha MRN: [id number] DOB: 07-28-1945  Referred by: Valerie Roys, DO Reason for referral : Chronic Care Management (Initial: COPD/HLD)   Brenda Rocha is a 74 y.o. year old female who is a primary care patient of Valerie Roys, DO. The CCM team was consulted for assistance with chronic disease management and care coordination needs related to HLD and COPD  Review of patient status, including review of consultants reports, relevant laboratory and other test results, and collaboration with appropriate care team members and the patient's provider was performed as part of comprehensive patient evaluation and provision of chronic care management services.    SDOH (Social Determinants of Health) assessments performed: Yes See Care Plan activities for detailed interventions related to SDOH)  SDOH Interventions     Most Recent Value  SDOH Interventions  SDOH Interventions for the Following Domains  Physical Activity, Tobacco  Physical Activity Interventions  Other (Comments), Patient Refused [The patient still works but has not structured exercise- education and support]  Tobacco Interventions  Patient Refused  Alcohol Brief Interventions/Follow-up  AUDIT Score <7 follow-up not indicated       Medications: Outpatient Encounter Medications as of 03/21/2019  Medication Sig  . albuterol (VENTOLIN HFA) 108 (90 Base) MCG/ACT inhaler Inhale 2 puffs into the lungs every 6 (six) hours as needed for wheezing or shortness of breath.  Marland Kitchen aspirin EC 81 MG tablet Take by mouth.  . calcium carbonate (OS-CAL - DOSED IN MG OF ELEMENTAL CALCIUM) 1250 (500 Ca) MG tablet Take 1 tablet by mouth.  . tiotropium (SPIRIVA HANDIHALER) 18 MCG inhalation capsule Place 1 capsule (18 mcg total) into inhaler and inhale daily.  . vitamin B-12 (CYANOCOBALAMIN) 100 MCG tablet Take 100 mcg by mouth daily.  . Vitamin D, Ergocalciferol, (DRISDOL)  1.25 MG (50000 UT) CAPS capsule TAKE 1 CAPSULE BY MOUTH EVERY 7 DAYS  . vitamin E 100 UNIT capsule Take by mouth daily.   No facility-administered encounter medications on file as of 03/21/2019.     Objective:   Goals Addressed            This Visit's Progress   . RNCM: I am watching what I eat and I do check my blood pressure at home       [place] (see longtitudinal plan of care for additional care plan information)  Current Barriers:  . Chronic Disease Management support, education, and care coordination needs related to HLD and COPD  Clinical Goal(s) related to HLD and COPD:  Over the next 120 days, patient will:  . Work with the care management team to address educational, disease management, and care coordination needs  . Begin or continue self health monitoring activities as directed today Measure and record blood pressure 4 times per week and adhere to a Heart healthy diet . Call provider office for new or worsened signs and symptoms Blood pressure findings outside established parameters, New or worsened symptom related to HLD and COPD . Call care management team with questions or concerns . Verbalize basic understanding of patient centered plan of care established today   Interventions related to HLD and COPD:  . Evaluation of current treatment plans and patient's adherence to plan as established by provider . Assessed patient understanding of disease states . Assessed patient's education and care coordination needs . Provided disease specific education to patient  . Collaborated with appropriate clinical care team members regarding patient  needs  Patient Self Care Activities related to HLD and COPD:  . Patient is unable to independently self-manage chronic health conditions  Initial goal documentation         [name] was given information about Chronic Care Management services today including:  1. CCM service includes personalized support from  designated clinical staff supervised by her physician, including individualized plan of care and coordination with other care providers 2. 24/7 contact phone numbers for assistance for urgent and routine care needs. 3. Service will only be billed when office clinical staff spend 20 minutes or more in a month to coordinate care. 4. Only one practitioner may furnish and bill the service in a calendar month. 5. The patient may stop CCM services at any time (effective at the end of the month) by phone call to the office staff. 6. The patient will be responsible for cost sharing (co-pay) of up to 20% of the service fee (after annual deductible is met).  Patient agreed to services and verbal consent obtained.   Plan:   The care management team will reach out to the patient again over the next 60 days.   Noreene Larsson RN, MSN, [facility] Mobile: [phone]

## 2019-03-24 DIAGNOSIS — Z0100 Encounter for examination of eyes and vision without abnormal findings: Secondary | ICD-10-CM | POA: Diagnosis not present

## 2019-03-27 ENCOUNTER — Ambulatory Visit (INDEPENDENT_AMBULATORY_CARE_PROVIDER_SITE_OTHER): Payer: Medicare HMO | Admitting: Family Medicine

## 2019-03-27 ENCOUNTER — Other Ambulatory Visit: Payer: Self-pay

## 2019-03-27 ENCOUNTER — Encounter: Payer: Self-pay | Admitting: Family Medicine

## 2019-03-27 VITALS — BP 146/83 | HR 77 | Temp 98.1°F

## 2019-03-27 DIAGNOSIS — G4700 Insomnia, unspecified: Secondary | ICD-10-CM

## 2019-03-27 DIAGNOSIS — J449 Chronic obstructive pulmonary disease, unspecified: Secondary | ICD-10-CM

## 2019-03-27 DIAGNOSIS — E782 Mixed hyperlipidemia: Secondary | ICD-10-CM | POA: Diagnosis not present

## 2019-03-27 NOTE — Assessment & Plan Note (Signed)
Under good control on current regimen. Continue current regimen. Continue to monitor. Call with any concerns. Refills up to date.   

## 2019-03-27 NOTE — Assessment & Plan Note (Signed)
Rechecking labs today. Await results. Treat as needed.  °

## 2019-03-27 NOTE — Progress Notes (Signed)
BP (!) 146/83 (BP Location: Right Arm, Patient Position: Sitting, Cuff Size: Normal)   Pulse 77   Temp 98.1 F (36.7 C) (Oral)   SpO2 93%    Subjective:    Patient ID: Brenda Rocha, female    DOB: [birth year], 74 y.o.   MRN: [id number]  HPI: Brenda Rocha is a 74 y.o. female  Chief Complaint  Patient presents with  . COPD  . Hyperlipidemia  . Insomnia   Has been having trouble sleeping  COPD COPD status: stable Satisfied with current treatment?: yes Oxygen use: no Dyspnea frequency: rarely Cough frequency: occasionally Rescue inhaler frequency: occasionally   Limitation of activity: no Productive cough: no Pneumovax: Up to Date Influenza: Up to Date  HYPERLIPIDEMIA Hyperlipidemia status: stable Satisfied with current treatment?  no Side effects:  no Supplements: none Aspirin:  no The 10-year ASCVD risk score Mikey Bussing DC Jr., et al., 2013) is: 26.5%   Values used to calculate the score:     Age: 25 years     Sex: Female     Is Non-Hispanic African American: No     Diabetic: No     Tobacco smoker: Yes     Systolic Blood Pressure: 123456 mmHg     Is BP treated: No     HDL Cholesterol: 67 mg/dL     Total Cholesterol: 225 mg/dL Chest pain:  no Coronary artery disease:  no  Relevant past medical, surgical, family and social history reviewed and updated as indicated. Interim medical history since our last visit reviewed. Allergies and medications reviewed and updated.  Review of Systems  Constitutional: Negative.   Respiratory: Negative.   Cardiovascular: Negative.   Gastrointestinal: Negative.   Musculoskeletal: Negative.   Skin: Negative.   Psychiatric/Behavioral: Positive for sleep disturbance. Negative for agitation, behavioral problems, confusion, decreased concentration, dysphoric mood, hallucinations, self-injury and suicidal ideas. The patient is not nervous/anxious and is not hyperactive.     Per HPI unless specifically indicated above       Objective:    BP (!) 146/83 (BP Location: Right Arm, Patient Position: Sitting, Cuff Size: Normal)   Pulse 77   Temp 98.1 F (36.7 C) (Oral)   SpO2 93%   Wt Readings from Last 3 Encounters:  09/26/18 88 lb (39.9 kg)  07/29/18 88 lb (39.9 kg)  03/28/18 88 lb (39.9 kg)    Physical Exam Vitals and nursing note reviewed.  Constitutional:      General: She is not in acute distress.    Appearance: Normal appearance. She is not ill-appearing, toxic-appearing or diaphoretic.  HENT:     Head: Normocephalic and atraumatic.     Right Ear: External ear normal.     Left Ear: External ear normal.     Nose: Nose normal.     Mouth/Throat:     Mouth: Mucous membranes are moist.     Pharynx: Oropharynx is clear.  Eyes:     General: No scleral icterus.       Right eye: No discharge.        Left eye: No discharge.     Extraocular Movements: Extraocular movements intact.     Conjunctiva/sclera: Conjunctivae normal.     Pupils: Pupils are equal, round, and reactive to Rocha.  Cardiovascular:     Rate and Rhythm: Normal rate and regular rhythm.     Pulses: Normal pulses.     Heart sounds: Normal heart sounds. No murmur. No friction rub. No gallop.   Pulmonary:  Effort: Pulmonary effort is normal. No respiratory distress.     Breath sounds: Normal breath sounds. No stridor. No wheezing, rhonchi or rales.  Chest:     Chest wall: No tenderness.  Musculoskeletal:        General: Normal range of motion.     Cervical back: Normal range of motion and neck supple.  Skin:    General: Skin is warm and dry.     Capillary Refill: Capillary refill takes less than 2 seconds.     Coloration: Skin is not jaundiced or pale.     Findings: No bruising, erythema, lesion or rash.  Neurological:     General: No focal deficit present.     Mental Status: She is alert and oriented to person, place, and time. Mental status is at baseline.  Psychiatric:        Mood and Affect: Mood normal.        Behavior:  Behavior normal.        Thought Content: Thought content normal.        Judgment: Judgment normal.     Results for orders placed or performed in visit on 09/27/18  CBC with Differential/Platelet  Result Value Ref Range   WBC 6.4 3.4 - 10.8 x10E3/uL   RBC 5.19 3.77 - 5.28 x10E6/uL   Hemoglobin 15.3 11.1 - 15.9 g/dL   Hematocrit 45.6 34.0 - 46.6 %   MCV 88 79 - 97 fL   MCH 29.5 26.6 - 33.0 pg   MCHC 33.6 31.5 - 35.7 g/dL   RDW 13.6 11.7 - 15.4 %   Platelets 286 150 - 450 x10E3/uL   Neutrophils 66 Not Estab. %   Lymphs 21 Not Estab. %   Monocytes 9 Not Estab. %   Eos 2 Not Estab. %   Basos 1 Not Estab. %   Neutrophils Absolute 4.3 1.4 - 7.0 x10E3/uL   Lymphocytes Absolute 1.3 0.7 - 3.1 x10E3/uL   Monocytes Absolute 0.6 0.1 - 0.9 x10E3/uL   EOS (ABSOLUTE) 0.1 0.0 - 0.4 x10E3/uL   Basophils Absolute 0.1 0.0 - 0.2 x10E3/uL   Immature Granulocytes 1 Not Estab. %   Immature Grans (Abs) 0.0 0.0 - 0.1 x10E3/uL  Comprehensive metabolic panel  Result Value Ref Range   Glucose 79 65 - 99 mg/dL   BUN 11 8 - 27 mg/dL   Creatinine, Ser 0.63 0.57 - 1.00 mg/dL   GFR calc non Af Amer 89 >59 mL/min/1.73   GFR calc Af Amer 103 >59 mL/min/1.73   BUN/Creatinine Ratio 17 12 - 28   Sodium 142 134 - 144 mmol/L   Potassium 4.8 3.5 - 5.2 mmol/L   Chloride 103 96 - 106 mmol/L   CO2 21 20 - 29 mmol/L   Calcium 9.1 8.7 - 10.3 mg/dL   Total Protein 6.9 6.0 - 8.5 g/dL   Albumin 4.3 3.7 - 4.7 g/dL   Globulin, Total 2.6 1.5 - 4.5 g/dL   Albumin/Globulin Ratio 1.7 1.2 - 2.2   Bilirubin Total 0.5 0.0 - 1.2 mg/dL   Alkaline Phosphatase 101 39 - 117 IU/L   AST 19 0 - 40 IU/L   ALT 16 0 - 32 IU/L  Lipid Panel w/o Chol/HDL Ratio  Result Value Ref Range   Cholesterol, Total 225 (H) 100 - 199 mg/dL   Triglycerides 123 0 - 149 mg/dL   HDL 67 >39 mg/dL   VLDL Cholesterol Cal 22 5 - 40 mg/dL   LDL Chol Calc (NIH) 136 (  H) 0 - 99 mg/dL  TSH  Result Value Ref Range   TSH 1.430 0.450 - 4.500 uIU/mL  UA/M  w/rflx Culture, Routine   Specimen: Urine   URINE  Result Value Ref Range   Specific Gravity, UA 1.025 1.005 - 1.030   pH, UA 5.0 5.0 - 7.5   Color, UA Yellow Yellow   Appearance Ur Clear Clear   Leukocytes,UA Negative Negative   Protein,UA Negative Negative/Trace   Glucose, UA Negative Negative   Ketones, UA Negative Negative   RBC, UA Negative Negative   Bilirubin, UA Negative Negative   Urobilinogen, Ur 0.2 0.2 - 1.0 mg/dL   Nitrite, UA Negative Negative  VITAMIN D 25 Hydroxy (Vit-D Deficiency, Fractures)  Result Value Ref Range   Vit D, 25-Hydroxy 13.6 (L) 30.0 - 100.0 ng/mL      Assessment & Plan:   Problem List Items Addressed This Visit      Respiratory   COLD (chronic obstructive lung disease) (HCC)    Under good control on current regimen. Continue current regimen. Continue to monitor. Call with any concerns. Refills up to date.          Other   Hyperlipidemia - Primary    Rechecking labs today. Await results. Treat as needed.      Relevant Orders   Comprehensive metabolic panel   Lipid Panel w/o Chol/HDL Ratio    Other Visit Diagnoses    Insomnia, unspecified type       Offered trazodone, she will hold off right now, start melatonin. Call with any concerns.        Follow up plan: Return in about 6 months (around 09/27/2019) for Physical.

## 2019-03-28 LAB — COMPREHENSIVE METABOLIC PANEL
ALT: 11 IU/L (ref 0–32)
AST: 20 IU/L (ref 0–40)
Albumin/Globulin Ratio: 1.8 (ref 1.2–2.2)
Albumin: 4.4 g/dL (ref 3.7–4.7)
Alkaline Phosphatase: 118 IU/L — ABNORMAL HIGH (ref 39–117)
BUN/Creatinine Ratio: 15 (ref 12–28)
BUN: 11 mg/dL (ref 8–27)
Bilirubin Total: 0.6 mg/dL (ref 0.0–1.2)
CO2: 22 mmol/L (ref 20–29)
Calcium: 9 mg/dL (ref 8.7–10.3)
Chloride: 102 mmol/L (ref 96–106)
Creatinine, Ser: 0.74 mg/dL (ref 0.57–1.00)
GFR calc Af Amer: 92 mL/min/{1.73_m2} (ref >59)
GFR calc non Af Amer: 80 mL/min/{1.73_m2} (ref >59)
Globulin, Total: 2.4 g/dL (ref 1.5–4.5)
Glucose: 87 mg/dL (ref 65–99)
Potassium: 4.9 mmol/L (ref 3.5–5.2)
Sodium: 141 mmol/L (ref 134–144)
Total Protein: 6.8 g/dL (ref 6.0–8.5)

## 2019-03-28 LAB — LIPID PANEL W/O CHOL/HDL RATIO
Cholesterol, Total: 245 mg/dL — ABNORMAL HIGH (ref 100–199)
HDL: 76 mg/dL (ref >39)
LDL Chol Calc (NIH): 153 mg/dL — ABNORMAL HIGH (ref 0–99)
Triglycerides: 96 mg/dL (ref 0–149)
VLDL Cholesterol Cal: 16 mg/dL (ref 5–40)

## 2019-03-31 ENCOUNTER — Encounter: Payer: Self-pay | Admitting: Family Medicine

## 2019-04-03 ENCOUNTER — Ambulatory Visit: Payer: Medicare HMO | Admitting: Urology

## 2019-05-13 ENCOUNTER — Telehealth: Payer: Self-pay

## 2019-05-13 ENCOUNTER — Ambulatory Visit: Payer: Self-pay | Admitting: General Practice

## 2019-05-13 NOTE — Chronic Care Management (AMB) (Signed)
  Chronic Care Management   Outreach Note  05/13/2019 Name: Brenda Rocha MRN: [id number] DOB: Mar 21, 1945  Referred by: Valerie Roys, DO Reason for referral : Chronic Care Management (Follow up on HLD/COPD)   An unsuccessful telephone outreach was attempted today. The patient was referred to the case management team for assistance with care management and care coordination.   Follow Up Plan: A HIPPA compliant phone message was left for the patient providing contact information and requesting a return call.   Noreene Larsson RN, MSN, [facility] Mobile: [phone]

## 2019-06-13 ENCOUNTER — Telehealth: Payer: Self-pay | Admitting: General Practice

## 2019-06-13 ENCOUNTER — Ambulatory Visit (INDEPENDENT_AMBULATORY_CARE_PROVIDER_SITE_OTHER): Payer: Medicare HMO | Admitting: General Practice

## 2019-06-13 DIAGNOSIS — E782 Mixed hyperlipidemia: Secondary | ICD-10-CM | POA: Diagnosis not present

## 2019-06-13 DIAGNOSIS — J449 Chronic obstructive pulmonary disease, unspecified: Secondary | ICD-10-CM

## 2019-06-13 NOTE — Patient Instructions (Signed)
Visit Information  Goals Addressed            This Visit's Progress    RNCM: I am watching what I eat and I do check my blood pressure at home       [place] (see longtitudinal plan of care for additional care plan information)  Current Barriers:   Chronic Disease Management support, education, and care coordination needs related to HLD and COPD  Clinical Goal(s) related to HLD and COPD:  Over the next 120 days, patient will:   Work with the care management team to address educational, disease management, and care coordination needs   Begin or continue self health monitoring activities as directed today Measure and record blood pressure 4 times per week and adhere to a Heart healthy diet  Call provider office for new or worsened signs and symptoms Blood pressure findings outside established parameters, New or worsened symptom related to HLD and COPD  Call care management team with questions or concerns  Verbalize basic understanding of patient centered plan of care established today   Interventions related to HLD and COPD:   Evaluation of current treatment plans and patient's adherence to plan as established by provider.  Saw Dr. Wynetta Emery in March. Is doing well. The patient is having some stress because she is trying to find a house to buy.  The patients current home [place] area and she says she is going to have to move. She is also stressed because of her baby brother [name] lives with her and he doesn't help out. She is very frustrated that he doesn't help.   Assessed patient understanding of disease states.  The patient has good understanding of chronic diseases and managing.    Assessed patient's education and care coordination needs.  Education and support given on Heart Healthy Diet.  The patient is compliant with her dietary restrictions. She likes to treat herself sometimes to [place] food. Her weight is 88.3 pounds. The patient states she eats all of the  time but her weight stays the same.   Evaluation of mental status. The patient tries to stay positive and around people that are positive. 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 She stays busy. She loves her animals and has trained horses in the past.   Provided disease specific education to patient.  Education on activity level. The patient is enjoying the warmer weather and sitting out on her deck.   Collaborated with appropriate clinical care team members regarding patient needs  Patient Self Care Activities related to HLD and COPD:   Patient is unable to independently self-manage chronic health conditions  Please see past updates related to this goal by clicking on the "Past Updates" button in the selected goal         Patient verbalizes understanding of instructions provided today.   The care management team will reach out to the patient again over the next 60 days.   Noreene Larsson RN, MSN, [facility] Mobile: [phone]

## 2019-06-13 NOTE — Chronic Care Management (AMB) (Signed)
Chronic Care Management   Follow Up Note   06/13/2019 Name: Brenda Rocha MRN: [id number] DOB: 1945/11/16  Referred by: Valerie Roys, DO Reason for referral : Chronic Care Management (Follow up: COPD/HLD and new concerns)   Brenda Rocha is a 74 y.o. year old female who is a primary care patient of Valerie Roys, DO. The CCM team was consulted for assistance with chronic disease management and care coordination needs.    Review of patient status, including review of consultants reports, relevant laboratory and other test results, and collaboration with appropriate care team members and the patient's provider was performed as part of comprehensive patient evaluation and provision of chronic care management services.    SDOH (Social Determinants of Health) assessments performed: Yes See Care Plan activities for detailed interventions related to [facility])     Outpatient Encounter Medications as of 06/13/2019  Medication Sig  . albuterol (VENTOLIN HFA) 108 (90 Base) MCG/ACT inhaler Inhale 2 puffs into the lungs every 6 (six) hours as needed for wheezing or shortness of breath.  Marland Kitchen aspirin EC 81 MG tablet Take by mouth.  . calcium carbonate (OS-CAL - DOSED IN MG OF ELEMENTAL CALCIUM) 1250 (500 Ca) MG tablet Take 1 tablet by mouth.  . tiotropium (SPIRIVA HANDIHALER) 18 MCG inhalation capsule Place 1 capsule (18 mcg total) into inhaler and inhale daily.  . vitamin B-12 (CYANOCOBALAMIN) 100 MCG tablet Take 100 mcg by mouth daily.  . Vitamin D, Ergocalciferol, (DRISDOL) 1.25 MG (50000 UT) CAPS capsule TAKE 1 CAPSULE BY MOUTH EVERY 7 DAYS  . vitamin E 100 UNIT capsule Take by mouth daily.   No facility-administered encounter medications on file as of 06/13/2019.     Objective:  BP Readings from Last 3 Encounters:  03/27/19 (!) 146/83  09/27/18 (!) 147/82  08/06/18 119/78    Goals Addressed            This Visit's Progress   . RNCM: I am watching what I eat and I do check my  blood pressure at home       [place] (see longtitudinal plan of care for additional care plan information)  Current Barriers:  . Chronic Disease Management support, education, and care coordination needs related to HLD and COPD  Clinical Goal(s) related to HLD and COPD:  Over the next 120 days, patient will:  . Work with the care management team to address educational, disease management, and care coordination needs  . Begin or continue self health monitoring activities as directed today Measure and record blood pressure 4 times per week and adhere to a Heart healthy diet . Call provider office for new or worsened signs and symptoms Blood pressure findings outside established parameters, New or worsened symptom related to HLD and COPD . Call care management team with questions or concerns . Verbalize basic understanding of patient centered plan of care established today   Interventions related to HLD and COPD:  . Evaluation of current treatment plans and patient's adherence to plan as established by provider.  Saw Dr. Wynetta Emery in March. Is doing well. The patient is having some stress because she is trying to find a house to buy.  The patients current home [place] in a [place]zoning area and she says she is going to have to move. She is also stressed because of her baby brother [name] lives with her and he doesn't help out. She is very frustrated that he doesn't help.  . Assessed patient understanding of  disease states.  The patient has good understanding of chronic diseases and managing.   . Assessed patient's education and care coordination needs.  Education and support given on Heart Healthy Diet.  The patient is compliant with her dietary restrictions. She likes to treat herself sometimes to [place] food. Her weight is 88.3 pounds. The patient states she eats all of the time but her weight stays the same.  . Evaluation of mental status. The patient tries to stay positive and around people that  are positive. 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 She stays busy. She loves her animals and has trained horses in the past.  . Provided disease specific education to patient.  Education on activity level. The patient is enjoying the warmer weather and sitting out on her deck.  [name] with appropriate clinical care team members regarding patient needs  Patient Self Care Activities related to HLD and COPD:  . Patient is unable to independently self-manage chronic health conditions  Please see past updates related to this goal by clicking on the "Past Updates" button in the selected goal          Plan:   The care management team will reach out to the patient again over the next 60 days.    Noreene Larsson RN, MSN, [facility] Mobile: [phone]

## 2019-08-15 ENCOUNTER — Telehealth: Payer: Self-pay | Admitting: General Practice

## 2019-08-15 ENCOUNTER — Telehealth: Payer: Self-pay

## 2019-08-15 NOTE — Telephone Encounter (Signed)
°  Chronic Care Management   Outreach Note  08/15/2019 Name: ZITLALI PRIMM MRN: [id number] DOB: Jan 29, 1945  Referred by: Valerie Roys, DO Reason for referral : Appointment (RNCM Follow up Call: Chronic Disease Management and Care Coordiantion Needs)   An unsuccessful telephone outreach was attempted today. The patient was referred to the case management team for assistance with care management and care coordination.   Follow Up Plan: A HIPPA compliant phone message was left for the patient providing contact information and requesting a return call.   Noreene Larsson RN, MSN, [facility] Mobile: [phone]

## 2019-08-18 ENCOUNTER — Telehealth: Payer: Self-pay | Admitting: *Deleted

## 2019-08-18 NOTE — Telephone Encounter (Signed)
Brenda Rocha is rescheduled and aware of new phone call appt on 09/17/2019  Thanks   Erline Levine

## 2019-08-18 NOTE — Chronic Care Management (AMB) (Signed)
  Chronic Care Management   Note  08/18/2019 Name: TIEN AISPURO MRN: [id number] DOB: [birth year]  EVANNA WASHINTON is a 74 y.o. year old female who is a primary care patient of Valerie Roys, DO and is actively engaged with the care management team. I reached out to Lilli Light by phone today to assist with re-scheduling a follow up visit with the RN Case Manager  Follow up plan: Telephone appointment with care management team member scheduled for:09/17/2019  Masaji Billups  Care Guide, [facility]  [place], [place] [address] Direct Dial: [email or web address] Website: [email or web address]

## 2019-09-08 ENCOUNTER — Telehealth: Payer: Self-pay | Admitting: General Practice

## 2019-09-08 NOTE — Telephone Encounter (Signed)
°  Chronic Care Management   Outreach Note  09/08/2019 Name: Brenda Rocha MRN: [id number] DOB: 31-Jul-1945  Referred by: Valerie Roys, DO Reason for referral : Appointment (RNCM follow up call after the patient called and left a message asking for a call back from the [facility]- left VM)   An unsuccessful telephone outreach was attempted today. The patient was referred to the case management team for assistance with care management and care coordination.   Follow Up Plan: A HIPPA compliant phone message was left for the patient providing contact information and requesting a return call.   Noreene Larsson RN, MSN, [facility] Mobile: [phone]

## 2019-09-17 ENCOUNTER — Ambulatory Visit: Payer: Self-pay | Admitting: General Practice

## 2019-09-17 ENCOUNTER — Telehealth: Payer: Medicare HMO | Admitting: General Practice

## 2019-09-17 DIAGNOSIS — J449 Chronic obstructive pulmonary disease, unspecified: Secondary | ICD-10-CM

## 2019-09-17 DIAGNOSIS — E782 Mixed hyperlipidemia: Secondary | ICD-10-CM

## 2019-09-17 NOTE — Chronic Care Management (AMB) (Signed)
Chronic Care Management   Follow Up Note   09/17/2019 Name: Brenda Rocha MRN: [id number] DOB: 12-29-1945  Referred by: Brenda Roys, DO Reason for referral : Chronic Care Management (RNCM follow up call for Chronic Disease Management and Care Coordination Needs )   Brenda Rocha is a 74 y.o. year old female who is a primary care patient of Brenda Roys, DO. The CCM team was consulted for assistance with chronic disease management and care coordination needs.    Review of patient status, including review of consultants reports, relevant laboratory and other test results, and collaboration with appropriate care team members and the patient's provider was performed as part of comprehensive patient evaluation and provision of chronic care management services.    SDOH (Social Determinants of Health) assessments performed: Yes See Care Plan activities for detailed interventions related to [facility])     Outpatient Encounter Medications as of 09/17/2019  Medication Sig  . albuterol (VENTOLIN HFA) 108 (90 Base) MCG/ACT inhaler Inhale 2 puffs into the lungs every 6 (six) hours as needed for wheezing or shortness of breath.  Marland Kitchen aspirin EC 81 MG tablet Take by mouth.  . calcium carbonate (OS-CAL - DOSED IN MG OF ELEMENTAL CALCIUM) 1250 (500 Ca) MG tablet Take 1 tablet by mouth.  . tiotropium (SPIRIVA HANDIHALER) 18 MCG inhalation capsule Place 1 capsule (18 mcg total) into inhaler and inhale daily.  . vitamin B-12 (CYANOCOBALAMIN) 100 MCG tablet Take 100 mcg by mouth daily.  . Vitamin D, Ergocalciferol, (DRISDOL) 1.25 MG (50000 UT) CAPS capsule TAKE 1 CAPSULE BY MOUTH EVERY 7 DAYS  . vitamin E 100 UNIT capsule Take by mouth daily.   No facility-administered encounter medications on file as of 09/17/2019.     Objective:   Goals Addressed            This Visit's Progress   . RNCM: I am watching what I eat and I do check my blood pressure at home       [place] (see  longtitudinal plan of care for additional care plan information)  Current Barriers:  . Chronic Disease Management support, education, and care coordination needs related to HLD and COPD  Clinical Goal(s) related to HLD and COPD:  Over the next 120 days, patient will:  . Work with the care management team to address educational, disease management, and care coordination needs  . Begin or continue self health monitoring activities as directed today Measure and record blood pressure 4 times per week and adhere to a Heart healthy diet . Call provider office for new or worsened signs and symptoms Blood pressure findings outside established parameters, New or worsened symptom related to HLD and COPD . Call care management team with questions or concerns . Verbalize basic understanding of patient centered plan of care established today   Interventions related to HLD and COPD:  . Evaluation of current treatment plans and patient's adherence to plan as established by provider.  Saw Dr. Wynetta Emery in March. Is doing well. The patient is having some stress because she is trying to find a house to buy.  The patients current home is in a rezoning area and she says she is going to have to move. She is also stressed because of her baby brother Brenda Rocha lives with her and he doesn't help out. She is very frustrated that he doesn't help. 09-17-2019: The patient is in the process of moving, but has not found a house to move to  yet. She is waiting on some houses to open up.   . Assessed patient understanding of disease states.  The patient has good understanding of chronic diseases and managing well.  . Assessed patient's education and care coordination needs.  Education and support given on Heart Healthy Diet.  The patient is compliant with her dietary restrictions. She likes to treat herself sometimes to [place] food. Her weight is 88.3 pounds. The patient states she eats all of the time but her weight stays the same.  09-17-2019- The patient is eating well.  The patient is maintaining her weight.  . Evaluation of mental status. The patient tries to stay positive and around people that are positive. 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 She stays busy. She loves her animals and has trained horses in the past.  . Provided disease specific education to patient.  Education on activity level. The patient is enjoying the warmer weather and sitting out on her deck. 09-17-2019: The patient loves the hot weather but it does impact her breathing. She paces her activities and does well most of the time.  [name] with appropriate clinical care team members regarding patient needs.  The patient is aware of LCSW and Pharmacist. Denies any needs at this time.  . Evaluation of COVID vaccinations. The patient states that she is not taking the [place] vaccinations. She has talked to several people and she has decided this is the best for her. Education and support given.   Patient Self Care Activities related to HLD and COPD:  . Patient is unable to independently self-manage chronic health conditions  Please see past updates related to this goal by clicking on the "Past Updates" button in the selected goal          Plan:   Telephone follow up appointment with care management team member scheduled for: 12-03-2019 at 1 pm   Schubert, MSN, [facility] Mobile: [phone]

## 2019-09-17 NOTE — Patient Instructions (Signed)
Visit Information  Goals Addressed            This Visit's Progress   . RNCM: I am watching what I eat and I do check my blood pressure at home       [place] (see longtitudinal plan of care for additional care plan information)  Current Barriers:  . Chronic Disease Management support, education, and care coordination needs related to HLD and COPD  Clinical Goal(s) related to HLD and COPD:  Over the next 120 days, patient will:  . Work with the care management team to address educational, disease management, and care coordination needs  . Begin or continue self health monitoring activities as directed today Measure and record blood pressure 4 times per week and adhere to a Heart healthy diet . Call provider office for new or worsened signs and symptoms Blood pressure findings outside established parameters, New or worsened symptom related to HLD and COPD . Call care management team with questions or concerns . Verbalize basic understanding of patient centered plan of care established today   Interventions related to HLD and COPD:  . Evaluation of current treatment plans and patient's adherence to plan as established by provider.  Saw Dr. Wynetta Emery in March. Is doing well. The patient is having some stress because she is trying to find a house to buy.  The patients current home is in a rezoning area and she says she is going to have to move. She is also stressed because of her baby brother [name] lives with her and he doesn't help out. She is very frustrated that he doesn't help. 09-17-2019: The patient is in the process of moving, but has not found a house to move to yet. She is waiting on some houses to open up.   . Assessed patient understanding of disease states.  The patient has good understanding of chronic diseases and managing well.  . Assessed patient's education and care coordination needs.  Education and support given on Heart Healthy Diet.  The patient is compliant with her  dietary restrictions. She likes to treat herself sometimes to [place] food. Her weight is 88.3 pounds. The patient states she eats all of the time but her weight stays the same. 09-17-2019- The patient is eating well.  The patient is maintaining her weight.  . Evaluation of mental status. The patient tries to stay positive and around people that are positive. 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 She stays busy. She loves her animals and has trained horses in the past.  . Provided disease specific education to patient.  Education on activity level. The patient is enjoying the warmer weather and sitting out on her deck. 09-17-2019: The patient loves the hot weather but it does impact her breathing. She paces her activities and does well most of the time.  [name] with appropriate clinical care team members regarding patient needs.  The patient is aware of LCSW and Pharmacist. Denies any needs at this time.  . Evaluation of COVID vaccinations. The patient states that she is not taking the [place] vaccinations. She has talked to several people and she has decided this is the best for her. Education and support given.   Patient Self Care Activities related to HLD and COPD:  . Patient is unable to independently self-manage chronic health conditions  Please see past updates related to this  goal by clicking on the "Past Updates" button in the selected goal         Patient verbalizes understanding of instructions provided today.   Telephone follow up appointment with care management team member scheduled for: 12-03-2019  Noreene Larsson RN, MSN, [facility] Mobile: [phone]

## 2019-09-29 ENCOUNTER — Telehealth: Payer: Self-pay | Admitting: Family Medicine

## 2019-09-29 ENCOUNTER — Other Ambulatory Visit: Payer: Self-pay | Admitting: Family Medicine

## 2019-09-29 MED ORDER — SPIRIVA HANDIHALER 18 MCG IN CAPS: 18.0000 ug | ORAL_CAPSULE | Freq: Every day | RESPIRATORY_TRACT | 12 refills | Status: AC

## 2019-09-29 NOTE — Telephone Encounter (Signed)
Copied from [place] [id number]. Topic: Medicare AWV >> Sep 29, 2019 12:57 PM Cher Nakai R wrote: Reason for CRM:  Left message for patient to call back and schedule the Medicare Annual Wellness Visit (AWV) virtually.  Last AWV 09/26/2018  Please schedule at anytime with CFP-Nurse Health Advisor.  45 minute appointment  Any questions, please call me at [phone]

## 2019-09-29 NOTE — Telephone Encounter (Signed)
Medication Refill - Medication: tiotropium (SPIRIVA HANDIHALER) 18 MCG inhalation capsule    Has the patient contacted their pharmacy? yes (Agent: If no, request that the patient contact the pharmacy for the refill.) (Agent: If yes, when and what did the pharmacy advise?)Contact PCP  Preferred Pharmacy (with phone number or street name):  [id number] [address], [facility] Phone:  [phone]  Fax:  [phone]       Agent: Please be advised that RX refills may take up to 3 business days. We ask that you follow-up with your pharmacy.

## 2019-10-10 ENCOUNTER — Ambulatory Visit (INDEPENDENT_AMBULATORY_CARE_PROVIDER_SITE_OTHER): Payer: Medicare HMO

## 2019-10-10 VITALS — Ht 61.0 in | Wt 90.0 lb

## 2019-10-10 DIAGNOSIS — Z0000 Encounter for general adult medical examination without abnormal findings: Secondary | ICD-10-CM

## 2019-10-10 NOTE — Patient Instructions (Signed)
Brenda Rocha , Thank you for taking time to come for your Medicare Wellness Visit. I appreciate your ongoing commitment to your health goals. Please review the following plan we discussed and let me know if I can assist you in the future.   Screening recommendations/referrals: Colonoscopy: decline Mammogram: not required Bone Density: completed 09/07/2016 Recommended yearly ophthalmology/optometry visit for glaucoma screening and checkup Recommended yearly dental visit for hygiene and checkup  Vaccinations: Influenza vaccine: due Pneumococcal vaccine: completed 09/10/2014 Tdap vaccine: completed 11/30/2009 Shingles vaccine: discussed   Covid-19: decline  Advanced directives: Please bring a copy of your POA (Power of Attorney) and/or Living Will to your next appointment.   Conditions/risks identified: smoking  Next appointment: Follow up in one year for your annual wellness visit    Preventive Care 38 Years and Older, Female Preventive care refers to lifestyle choices and visits with your health care provider that can promote health and wellness. What does preventive care include?  A yearly physical exam. This is also called an annual well check.  Dental exams once or twice a year.  Routine eye exams. Ask your health care provider how often you should have your eyes checked.  Personal lifestyle choices, including:  Daily care of your teeth and gums.  Regular physical activity.  Eating a healthy diet.  Avoiding tobacco and drug use.  Limiting alcohol use.  Practicing safe sex.  Taking low-dose aspirin every day.  Taking vitamin and mineral supplements as recommended by your health care provider. What happens during an annual well check? The services and screenings done by your health care provider during your annual well check will depend on your age, overall health, lifestyle risk factors, and family history of disease. Counseling  Your health care provider may ask  you questions about your:  Alcohol use.  Tobacco use.  Drug use.  Emotional well-being.  Home and relationship well-being.  Sexual activity.  Eating habits.  History of falls.  Memory and ability to understand (cognition).  Work and work Statistician.  Reproductive health. Screening  You may have the following tests or measurements:  Height, weight, and BMI.  Blood pressure.  Lipid and cholesterol levels. These may be checked every 5 years, or more frequently if you are over 40 years old.  Skin check.  Lung cancer screening. You may have this screening every year starting at age 31 if you have a 30-pack-year history of smoking and currently smoke or have quit within the past 15 years.  Fecal occult blood test (FOBT) of the stool. You may have this test every year starting at age 98.  Flexible sigmoidoscopy or colonoscopy. You may have a sigmoidoscopy every 5 years or a colonoscopy every 10 years starting at age 67.  Hepatitis C blood test.  Hepatitis B blood test.  Sexually transmitted disease (STD) testing.  Diabetes screening. This is done by checking your blood sugar (glucose) after you have not eaten for a while (fasting). You may have this done every 1-3 years.  Bone density scan. This is done to screen for osteoporosis. You may have this done starting at age 64.  Mammogram. This may be done every 1-2 years. Talk to your health care provider about how often you should have regular mammograms. Talk with your health care provider about your test results, treatment options, and if necessary, the need for more tests. Vaccines  Your health care provider may recommend certain vaccines, such as:  Influenza vaccine. This is recommended every year.  Tetanus,  diphtheria, and acellular pertussis (Tdap, Td) vaccine. You may need a Td booster every 10 years.  Zoster vaccine. You may need this after age 15.  Pneumococcal 13-valent conjugate (PCV13) vaccine. One dose  is recommended after age 44.  Pneumococcal polysaccharide (PPSV23) vaccine. One dose is recommended after age 75. Talk to your health care provider about which screenings and vaccines you need and how often you need them. This information is not intended to replace advice given to you by your health care provider. Make sure you discuss any questions you have with your health care provider. Document Released: 01/29/2015 Document Revised: 09/22/2015 Document Reviewed: 11/03/2014 Elsevier Interactive Patient Education  2017 [place] Prevention in the Home Falls can cause injuries. They can happen to people of all ages. There are many things you can do to make your home safe and to help prevent falls. What can I do on the outside of my home?  Regularly fix the edges of walkways and driveways and fix any cracks.  Remove anything that might make you trip as you walk through a door, such as a raised step or threshold.  Trim any bushes or trees on the path to your home.  Use bright outdoor lighting.  Clear any walking paths of anything that might make someone trip, such as rocks or tools.  Regularly check to see if handrails are loose or broken. Make sure that both sides of any steps have handrails.  Any raised decks and porches should have guardrails on the edges.  Have any leaves, snow, or ice cleared regularly.  Use sand or salt on walking paths during winter.  Clean up any spills in your garage right away. This includes oil or grease spills. What can I do in the bathroom?  Use night lights.  Install grab bars by the toilet and in the tub and shower. Do not use towel bars as grab bars.  Use non-skid mats or decals in the tub or shower.  If you need to sit down in the shower, use a plastic, non-slip stool.  Keep the floor dry. Clean up any water that spills on the floor as soon as it happens.  Remove soap buildup in the tub or shower regularly.  Attach bath mats  securely with double-sided non-slip rug tape.  Do not have throw rugs and other things on the floor that can make you trip. What can I do in the bedroom?  Use night lights.  Make sure that you have a light by your bed that is easy to reach.  Do not use any sheets or blankets that are too big for your bed. They should not hang down onto the floor.  Have a firm chair that has side arms. You can use this for support while you get dressed.  Do not have throw rugs and other things on the floor that can make you trip. What can I do in the kitchen?  Clean up any spills right away.  Avoid walking on wet floors.  Keep items that you use a lot in easy-to-reach places.  If you need to reach something above you, use a strong step stool that has a grab bar.  Keep electrical cords out of the way.  Do not use floor polish or wax that makes floors slippery. If you must use wax, use non-skid floor wax.  Do not have throw rugs and other things on the floor that can make you trip. What can I do with  my stairs?  Do not leave any items on the stairs.  Make sure that there are handrails on both sides of the stairs and use them. Fix handrails that are broken or loose. Make sure that handrails are as long as the stairways.  Check any carpeting to make sure that it is firmly attached to the stairs. Fix any carpet that is loose or worn.  Avoid having throw rugs at the top or bottom of the stairs. If you do have throw rugs, attach them to the floor with carpet tape.  Make sure that you have a light switch at the top of the stairs and the bottom of the stairs. If you do not have them, ask someone to add them for you. What else can I do to help prevent falls?  Wear shoes that:  Do not have high heels.  Have rubber bottoms.  Are comfortable and fit you well.  Are closed at the toe. Do not wear sandals.  If you use a stepladder:  Make sure that it is fully opened. Do not climb a closed  stepladder.  Make sure that both sides of the stepladder are locked into place.  Ask someone to hold it for you, if possible.  Clearly mark and make sure that you can see:  Any grab bars or handrails.  First and last steps.  Where the edge of each step is.  Use tools that help you move around (mobility aids) if they are needed. These include:  Canes.  Walkers.  Scooters.  Crutches.  Turn on the lights when you go into a dark area. Replace any light bulbs as soon as they burn out.  Set up your furniture so you have a clear path. Avoid moving your furniture around.  If any of your floors are uneven, fix them.  If there are any pets around you, be aware of where they are.  Review your medicines with your doctor. Some medicines can make you feel dizzy. This can increase your chance of falling. Ask your doctor what other things that you can do to help prevent falls. This information is not intended to replace advice given to you by your health care provider. Make sure you discuss any questions you have with your health care provider. Document Released: 10/29/2008 Document Revised: 06/10/2015 Document Reviewed: 02/06/2014 Elsevier Interactive Patient Education  2017 [employer].

## 2019-10-10 NOTE — Progress Notes (Signed)
I connected with Brenda Rocha today by telephone and verified that I am speaking with the correct person using two identifiers. Location patient: home Location provider: work Persons participating in the virtual visit: Brenda Rocha, [place] LPN.   I discussed the limitations, risks, security and privacy concerns of performing an evaluation and management service by telephone and the availability of in person appointments. I also discussed with the patient that there may be a patient responsible charge related to this service. The patient expressed understanding and verbally consented to this telephonic visit.    Interactive audio and video telecommunications were attempted between this provider and patient, however failed, due to patient having technical difficulties OR patient did not have access to video capability.  We continued and completed visit with audio only.     Vital signs may be patient reported or missing.  Subjective:   Brenda Rocha is a 74 y.o. female who presents for Medicare Annual (Subsequent) preventive examination.  Review of Systems     Cardiac Risk Factors include: advanced age (>57men, >65 women);smoking/ tobacco exposure;sedentary lifestyle     Objective:    Today's Vitals   10/10/19 1512  Weight: 90 lb (40.8 kg)  Height: 5\' 1"  (1.549 m)   Body mass index is 17.01 kg/m.  Advanced Directives 10/10/2019 09/26/2018 09/24/2017 10/07/2016 03/18/2015  Does Patient Have a Medical Advance Directive? Yes Yes Yes Yes Yes  Type of Paramedic of [place];Living will Living will;Healthcare Power of [place];Living will [place] will  Does patient want to make changes to medical advance directive? - - - No - Patient declined No - Patient declined  Copy of [place] in Chart? No - copy requested No - copy requested No - copy requested No - copy requested No - copy  requested  Would patient like information on creating a medical advance directive? - - - No - Patient declined -    Current Medications (verified) Outpatient Encounter Medications as of 10/10/2019  Medication Sig  . albuterol (VENTOLIN HFA) 108 (90 Base) MCG/ACT inhaler Inhale 2 puffs into the lungs every 6 (six) hours as needed for wheezing or shortness of breath.  Marland Kitchen aspirin EC 81 MG tablet Take by mouth.  . calcium carbonate (OS-CAL - DOSED IN MG OF ELEMENTAL CALCIUM) 1250 (500 Ca) MG tablet Take 1 tablet by mouth.  . tiotropium (SPIRIVA HANDIHALER) 18 MCG inhalation capsule Place 1 capsule (18 mcg total) into inhaler and inhale daily.  . vitamin B-12 (CYANOCOBALAMIN) 100 MCG tablet Take 100 mcg by mouth daily.  . Vitamin D, Ergocalciferol, (DRISDOL) 1.25 MG (50000 UT) CAPS capsule TAKE 1 CAPSULE BY MOUTH EVERY 7 DAYS  . vitamin E 100 UNIT capsule Take by mouth daily.   No facility-administered encounter medications on file as of 10/10/2019.    Allergies (verified) Patient has no known allergies.   History: Past Medical History:  Diagnosis Date  . Breast cancer, right [facility]) 2003   Bilateral mastectomies.   Marland Kitchen COPD (chronic obstructive pulmonary disease) ([place])   . Neurofibromatosis [facility])    Past Surgical History:  Procedure Laterality Date  . ABDOMINAL HYSTERECTOMY    . BREAST SURGERY     mastectomy  . CHOLECYSTECTOMY    . EYE SURGERY Bilateral 08/2018   Dr.Shah  . MASTECTOMY Bilateral    Family History  Problem Relation Age of Onset  . Cancer Mother        breast  .  Heart disease Mother   . Thyroid disease Mother   . Hypertension Mother   . Osteoporosis Mother   . Hypertension Sister   . Hypertension Brother   . Cancer Brother        bladder cancer  . CVA Brother   . Cancer Maternal Grandmother        breast , bone  . Bladder Cancer Neg Hx   . Kidney cancer Neg Hx    Social History   Socioeconomic History  . Marital status: Widowed    Spouse name: Not on  file  . Number of children: Not on file  . Years of education: 25 years college   . Highest education level: Associate degree: academic program  Occupational History    Employer: [employer]  Tobacco Use  . Smoking status: Current Some Day Smoker    Packs/day: 0.25    Types: Cigarettes  . Smokeless tobacco: Never Used  . Tobacco comment: 1 pack will last a week   Vaping Use  . Vaping Use: Never used  Substance and Sexual Activity  . Alcohol use: Yes    Comment: Occasional  . Drug use: No  . Sexual activity: Not Currently  Other Topics Concern  . Not on file  Social History Narrative   Working 2-3 times a week at [employer] and [employer] of [employer]: Low Risk   . Difficulty of Paying Living Expenses: Not hard at all  Food Insecurity: No Food Insecurity  . Worried About Charity fundraiser in the Last Year: Never true  . Ran Out of Food in the Last Year: Never true  Transportation Needs: No Transportation Needs  . Lack of Transportation (Medical): No  . Lack of Transportation (Non-Medical): No  Physical Activity: Inactive  . Days of Exercise per Week: 0 days  . Minutes of Exercise per Session: 0 min  Stress: No Stress Concern Present  . Feeling of Stress : Not at all  Social Connections: Unknown  . Frequency of Communication with Friends and Family: More than three times a week  . Frequency of Social Gatherings with Friends and Family: More than three times a week  . Attends Religious Services: Not on file  . Active Member of Clubs or Organizations: Not on file  . Attends Archivist Meetings: Not on file  . Marital Status: Married    Tobacco Counseling Ready to quit: Not Answered Counseling given: Not Answered Comment: 1 pack will last a week    Clinical Intake:  Pre-visit preparation completed: Yes  Pain : No/denies pain     Nutritional Status: BMI <19  Underweight Nutritional Risks: Nausea/ vomitting/  diarrhea (vomiting from something she ate)  How often do you need to have someone help you when you read instructions, pamphlets, or other written materials from your doctor or pharmacy?: 1 - Never What is the last grade level you completed in school?: some college  Diabetic? No   Interpreter Needed?: No  Information entered by :: NAllen LPN   Activities of Daily Living In your present state of health, do you have any difficulty performing the following activities: 10/10/2019  Hearing? N  Vision? N  Difficulty concentrating or making decisions? N  Walking or climbing stairs? N  Dressing or bathing? N  Doing errands, shopping? N  Preparing Food and eating ? N  Using the Toilet? N  In the past six months, have you accidently leaked urine?  Y  Do you have problems with loss of bowel control? N  Managing your Medications? N  Managing your Finances? N  Housekeeping or managing your Housekeeping? N  Some recent data might be hidden    Patient Care Team: Valerie Roys, DO as PCP - General (Family Medicine) Vanita Ingles, RN as Case Manager (General Practice)  Indicate any recent Medical Services you may have received from other than [facility] providers in the past year (date may be approximate).     Assessment:   This is a routine wellness examination for [facility].  Hearing/Vision screen  Hearing Screening   125Hz  250Hz  500Hz  1000Hz  2000Hz  3000Hz  4000Hz  6000Hz  8000Hz   Right ear:           Left ear:           Vision Screening Comments: Regular eye exams, Dr. Precious Bard  Dietary issues and exercise activities discussed: Current Exercise Habits: The patient does not participate in regular exercise at present  Goals    . Patient Stated     10/10/2019, no goals    . Quit Smoking     Smoking cessation discussed     . RNCM: I am watching what I eat and I do check my blood pressure at home     [place] (see longtitudinal plan of care for additional care plan  information)  Current Barriers:  . Chronic Disease Management support, education, and care coordination needs related to HLD and COPD  Clinical Goal(s) related to HLD and COPD:  Over the next 120 days, patient will:  . Work with the care management team to address educational, disease management, and care coordination needs  . Begin or continue self health monitoring activities as directed today Measure and record blood pressure 4 times per week and adhere to a Heart healthy diet . Call provider office for new or worsened signs and symptoms Blood pressure findings outside established parameters, New or worsened symptom related to HLD and COPD . Call care management team with questions or concerns . Verbalize basic understanding of patient centered plan of care established today   Interventions related to HLD and COPD:  . Evaluation of current treatment plans and patient's adherence to plan as established by provider.  Saw Dr. Wynetta Emery in March. Is doing well. The patient is having some stress because she is trying to find a house to buy.  The patients current home is in a [place]zoning area and she says she is going to have to move. She is also stressed because of her baby brother [name] lives with her and he doesn't help out. She is very frustrated that he doesn't help. 09-17-2019: The patient is in the process of moving, but has not found a house to move to yet. She is waiting on some houses to open up.   . Assessed patient understanding of disease states.  The patient has good understanding of chronic diseases and managing well.  . Assessed patient's education and care coordination needs.  Education and support given on Heart Healthy Diet.  The patient is compliant with her dietary restrictions. She likes to treat herself sometimes to [place] food. Her weight is 88.3 pounds. The patient states she eats all of the time but her weight stays the same. 09-17-2019- The patient is eating well.  The patient is  maintaining her weight.  . Evaluation of mental status. The patient tries to stay positive and around people that are positive. 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 She stays busy. She loves her animals and has trained horses in the past.  . Provided disease specific education to patient.  Education on activity level. The patient is enjoying the warmer weather and sitting out on her deck. 09-17-2019: The patient loves the hot weather but it does impact her breathing. She paces her activities and does well most of the time.  [name] with appropriate clinical care team members regarding patient needs.  The patient is aware of LCSW and Pharmacist. Denies any needs at this time.  . Evaluation of COVID vaccinations. The patient states that she is not taking the [place] vaccinations. She has talked to several people and she has decided this is the best for her. Education and support given.   Patient Self Care Activities related to HLD and COPD:  . Patient is unable to independently self-manage chronic health conditions  Please see past updates related to this goal by clicking on the "Past Updates" button in the selected goal        Depression Screen PHQ 2/9 Scores 10/10/2019 03/21/2019 09/26/2018 09/24/2017 09/24/2017 08/10/2016 03/18/2015  PHQ - 2 Score 0 0 0 0 0 0 0  PHQ- 9 Score - - - - - 0 -    Fall Risk Fall Risk  10/10/2019 09/26/2018 08/06/2018 09/24/2017 09/24/2017  Falls in the past year? 0 0 0 No No  Number falls in past yr: - - 0 - -  Injury with Fall? - - 0 - -  Risk for fall due to : No Fall Risks - History of fall(s) - -  Follow up Falls evaluation completed;Education provided;Falls prevention discussed - Falls evaluation completed - -    Any stairs in or around the home? Yes  If so, are there any without handrails? No  Home free of loose throw rugs in walkways, pet beds, electrical cords, etc? Yes   Adequate lighting in your home to reduce risk of falls? Yes   ASSISTIVE DEVICES UTILIZED TO PREVENT FALLS:  Life alert? No  Use of a cane, walker or w/c? No  Grab bars in the bathroom? No  Shower chair or bench in shower? No  Elevated toilet seat or a handicapped toilet? No   TIMED UP AND GO:  Was the test performed? No .     Cognitive Function:     6CIT Screen 10/10/2019 09/24/2017 08/10/2016  What Year? 0 points 0 points 0 points  What month? 0 points 0 points 0 points  What time? 0 points 0 points 0 points  Count back from 20 0 points 0 points 0 points  Months in reverse 4 points 0 points 0 points  Repeat phrase 0 points 0 points 4 points  Total Score 4 0 4    Immunizations Immunization History  Administered Date(s) Administered  . Fluad Quad(high Dose 65+) 09/27/2018  . Influenza, High Dose Seasonal PF 10/07/2015, 09/24/2017  . Influenza-Unspecified 11/17/2014, 08/24/2016  . Pneumococcal Conjugate-13 09/10/2014  . Pneumococcal Polysaccharide-23 11/28/2012  . Tdap 11/30/2009    TDAP status: Up to date Flu Vaccine status: Up to date Pneumococcal vaccine status: Completed during today's visit. Covid-19 vaccine status: Declined, Education has been provided regarding the importance of this vaccine but patient still declined. Advised may receive this vaccine at local pharmacy or Health Dept.or vaccine clinic. Aware to provide a copy of the vaccination record if obtained  from local pharmacy or Health Dept. Verbalized acceptance and understanding.  Qualifies for Shingles Vaccine? Yes   Zostavax completed Yes   Shingrix Completed?: No.    Education has been provided regarding the importance of this vaccine. Patient has been advised to call insurance company to determine out of pocket expense if they have not yet received this vaccine. Advised may also receive vaccine at local pharmacy or Health Dept. Verbalized acceptance and understanding.  Screening Tests Health  Maintenance  Topic Date Due  . COVID-19 Vaccine (1) Never done  . COLONOSCOPY  11/06/2016  . INFLUENZA VACCINE  08/17/2019  . TETANUS/TDAP  12/01/2019  . DEXA SCAN  Completed  . Hepatitis C Screening  Completed  . PNA vac Low Risk Adult  Completed    Health Maintenance  Health Maintenance Due  Topic Date Due  . COVID-19 Vaccine (1) Never done  . COLONOSCOPY  11/06/2016  . INFLUENZA VACCINE  08/17/2019    Colorectal cancer screening: decline Mammogram status: No longer required.  Bone Density status: Completed 09/07/2016.   Lung Cancer Screening: (Low Dose CT Chest recommended if Age 65-80 years, 30 pack-year currently smoking OR have quit w/in 15years.) does not qualify.   Lung Cancer Screening Referral: no  Additional Screening:  Hepatitis C Screening: does qualify; Completed 02/08/2017  Vision Screening: Recommended annual ophthalmology exams for early detection of glaucoma and other disorders of the eye. Is the patient up to date with their annual eye exam?  Yes  Who is the provider or what is the name of the office in which the patient attends annual eye exams? Dr. Precious Bard If pt is not established with a provider, would they like to be referred to a provider to establish care? No .   Dental Screening: Recommended annual dental exams for proper oral hygiene  Community Resource Referral / Chronic Care Management: CRR required this visit?  No   CCM required this visit?  No      Plan:     I have personally reviewed and noted the following in the patient's chart:   . Medical and social history . Use of alcohol, tobacco or illicit drugs  . Current medications and supplements . Functional ability and status . Nutritional status . Physical activity . Advanced directives . List of other physicians . Hospitalizations, surgeries, and ER visits in previous 12 months . Vitals . Screenings to include cognitive, depression, and falls . Referrals and appointments  In  addition, I have reviewed and discussed with patient certain preventive protocols, quality metrics, and best practice recommendations. A written personalized care plan for preventive services as well as general preventive health recommendations were provided to patient.     Kellie Simmering, LPN   [id number]   Nurse Notes:

## 2019-10-13 NOTE — Progress Notes (Signed)
Time spent on 10/10/2019 telephonic AWV was 43 minutes.

## 2019-12-03 ENCOUNTER — Telehealth: Payer: Self-pay | Admitting: General Practice

## 2019-12-03 ENCOUNTER — Ambulatory Visit: Payer: Self-pay | Admitting: General Practice

## 2019-12-03 DIAGNOSIS — E782 Mixed hyperlipidemia: Secondary | ICD-10-CM

## 2019-12-03 DIAGNOSIS — J449 Chronic obstructive pulmonary disease, unspecified: Secondary | ICD-10-CM

## 2019-12-03 NOTE — Patient Instructions (Signed)
Visit Information  Goals Addressed            This Visit's Progress    RNCM: I am watching what I eat and I do check my blood pressure at home       [place] (see longtitudinal plan of care for additional care plan information)  Current Barriers:   Chronic Disease Management support, education, and care coordination needs related to HLD and COPD  Clinical Goal(s) related to HLD and COPD:  Over the next 120 days, patient will:   Work with the care management team to address educational, disease management, and care coordination needs   Begin or continue self health monitoring activities as directed today Measure and record blood pressure 4 times per week and adhere to a Heart healthy diet  Call provider office for new or worsened signs and symptoms Blood pressure findings outside established parameters, New or worsened symptom related to HLD and COPD  Call care management team with questions or concerns  Verbalize basic understanding of patient centered plan of care established today   Interventions related to HLD and COPD:   Evaluation of current treatment plans and patient's adherence to plan as established by provider.  Saw Dr. Wynetta Emery in March. Is doing well. The patient is having some stress because she is trying to find a house to buy.  The patients current home is in a rezoning area and she says she is going to have to move. She is also stressed because of her baby brother [name] lives with her and he doesn't help out. She is very frustrated that he doesn't help. 12-03-2019: The patient is doing well. Denies any new concerns today.   Assessed patient understanding of disease states.  The patient has good understanding of chronic diseases and managing well.   Assessed patient's education and care coordination needs.  Education and support given on Heart Healthy Diet.  The patient is compliant with her dietary restrictions. She likes to treat herself sometimes to  [place] food. Her weight is 88.3 pounds. The patient states she eats all of the time but her weight stays the same. 12-03-2019- The patient is eating well.  The patient is maintaining her weight.   Evaluation of mental status. The patient tries to stay positive and around people that are positive. 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 She stays busy. She loves her animals and has trained horses in the past.   Provided disease specific education to patient.  Education on activity level. The patient is enjoying the warmer weather and sitting out on her deck. 12-03-2019: Education on staying hydrated and eating well.   Collaborated with appropriate clinical care team members regarding patient needs.  The patient is aware of LCSW and Pharmacist. Denies any needs at this time.   Evaluation of COVID vaccinations. The patient states that she is not taking the [place] vaccinations. She has talked to several people and she has decided this is the best for her. Education and support given.   Patient Self Care Activities related to HLD and COPD:   Patient is unable to independently self-manage chronic health conditions  Please see past updates related to this goal by clicking on the "Past Updates" button in the selected goal         The patient verbalized understanding of instructions, educational materials, and care plan provided today and  declined offer to receive copy of patient instructions, educational materials, and care plan.   Telephone follow up appointment with care management team member scheduled for: 02-04-2020 at 1 pm  Noreene Larsson RN, MSN, [facility] Mobile: [phone]

## 2019-12-03 NOTE — Telephone Encounter (Signed)
  Chronic Care Management   Note  12/03/2019 Name: ANYELY CUNNING MRN: [id number] DOB: 26-May-1945  The patient called back. See new encounter.  Follow up plan: Telephone follow up appointment with care management team member scheduled for: 02-04-2020 at 1 pm  [place], MSN, [facility] Mobile: [phone]

## 2019-12-03 NOTE — Chronic Care Management (AMB) (Signed)
Chronic Care Management   Follow Up Note   12/03/2019 Name: Brenda Rocha MRN: [id number] DOB: 29-Oct-1945  Referred by: Brenda Roys, DO Reason for referral : Chronic Care Management (RNCM Chronic Disease Management and Care Coordination)   Brenda Rocha is a 74 y.o. year old female who is a primary care patient of Brenda Roys, DO. The CCM team was consulted for assistance with chronic disease management and care coordination needs.    Review of patient status, including review of consultants reports, relevant laboratory and other test results, and collaboration with appropriate care team members and the patient's provider was performed as part of comprehensive patient evaluation and provision of chronic care management services.    SDOH (Social Determinants of Health) assessments performed: Yes See Care Plan activities for detailed interventions related to [facility])     Outpatient Encounter Medications as of 12/03/2019  Medication Sig  . albuterol (VENTOLIN HFA) 108 (90 Base) MCG/ACT inhaler Inhale 2 puffs into the lungs every 6 (six) hours as needed for wheezing or shortness of breath.  Marland Kitchen aspirin EC 81 MG tablet Take by mouth.  . calcium carbonate (OS-CAL - DOSED IN MG OF ELEMENTAL CALCIUM) 1250 (500 Ca) MG tablet Take 1 tablet by mouth.  . tiotropium (SPIRIVA HANDIHALER) 18 MCG inhalation capsule Place 1 capsule (18 mcg total) into inhaler and inhale daily.  . vitamin B-12 (CYANOCOBALAMIN) 100 MCG tablet Take 100 mcg by mouth daily.  . Vitamin D, Ergocalciferol, (DRISDOL) 1.25 MG (50000 UT) CAPS capsule TAKE 1 CAPSULE BY MOUTH EVERY 7 DAYS  . vitamin E 100 UNIT capsule Take by mouth daily.   No facility-administered encounter medications on file as of 12/03/2019.     Objective:   Goals Addressed            This Visit's Progress   . RNCM: I am watching what I eat and I do check my blood pressure at home       Brenda Rocha (see longtitudinal plan of care for  additional care plan information)  Current Barriers:  . Chronic Disease Management support, education, and care coordination needs related to HLD and COPD  Clinical Goal(s) related to HLD and COPD:  Over the next 120 days, patient will:  . Work with the care management team to address educational, disease management, and care coordination needs  . Begin or continue self health monitoring activities as directed today Measure and record blood pressure 4 times per week and adhere to a Heart healthy diet . Call provider office for new or worsened signs and symptoms Blood pressure findings outside established parameters, New or worsened symptom related to HLD and COPD . Call care management team with questions or concerns . Verbalize basic understanding of patient centered plan of care established today   Interventions related to HLD and COPD:  . Evaluation of current treatment plans and patient's adherence to plan as established by provider.  Saw Brenda Rocha in March. Is doing well. The patient is having some stress because she is trying to find a house to buy.  The patients current home [place]zoning area and she says she is going to have to move. She is also stressed because of her baby brother Brenda Rocha lives with her and he doesn't help out. She is very frustrated that he doesn't help. 12-03-2019: The patient is doing well. Denies any new concerns today.  . Assessed patient understanding of disease states.  The patient has good  understanding of chronic diseases and managing well.  . Assessed patient's education and care coordination needs.  Education and support given on Heart Healthy Diet.  The patient is compliant with her dietary restrictions. She likes to treat herself sometimes to [place] food. Her weight is 88.3 pounds. The patient states she eats all of the time but her weight stays the same. 12-03-2019- The patient is eating well.  The patient is maintaining her weight.  . Evaluation of  mental status. The patient tries to stay positive and around people that are positive. 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 She stays busy. She loves her animals and has trained horses in the past.  . Provided disease specific education to patient.  Education on activity level. The patient is enjoying the warmer weather and sitting out on her deck. 12-03-2019: Education on staying hydrated and eating well.  Brenda Rocha with appropriate clinical care team members regarding patient needs.  The patient is aware of LCSW and Pharmacist. Denies any needs at this time.  . Evaluation of COVID vaccinations. The patient states that she is not taking the Dane vaccinations. She has talked to several people and she has decided this is the best for her. Education and support given.   Patient Self Care Activities related to HLD and COPD:  . Patient is unable to independently self-manage chronic health conditions  Please see past updates related to this goal by clicking on the "Past Updates" button in the selected goal          Plan:   Telephone follow up appointment with care management team member scheduled for: 02-04-2020 at 1 pm   Brenda Larsson RN, MSN, [facility] Mobile: [phone]

## 2020-02-04 ENCOUNTER — Telehealth: Payer: Self-pay

## 2020-02-04 ENCOUNTER — Telehealth: Payer: Self-pay | Admitting: General Practice

## 2020-02-04 NOTE — Telephone Encounter (Signed)
  Chronic Care Management   Outreach Note  02/04/2020 Name: Brenda Rocha MRN: [id number] DOB: 12-30-1945  Referred by: Valerie Roys, DO Reason for referral : Appointment (RNCM: Follow up Call for Chronic Disease Management and Care Coordination Needs )   An unsuccessful telephone outreach was attempted today. The patient was referred to the case management team for assistance with care management and care coordination.   Follow Up Plan: A HIPAA compliant phone message was left for the patient providing contact information and requesting a return call.   Noreene Larsson RN, MSN, [facility] Mobile: [phone]

## 2020-02-10 ENCOUNTER — Telehealth: Payer: Self-pay

## 2020-02-11 ENCOUNTER — Telehealth: Payer: Self-pay | Admitting: General Practice

## 2020-02-11 NOTE — Telephone Encounter (Cosign Needed)
°  Chronic Care Management   Note  02/11/2020 Name: Brenda Rocha MRN: [id number] DOB: 11-20-1945  The daughter Anise Salvo had called and left a message asking for a call back from the [facility]. She wanted to let the [facility] know that the patient had passed away. She had her moms phone and found the number and wanted to let her friends know. Condolences given. Emotional support provided.   Follow up plan: No further follow up required: the patient is deceased. Passed away on 02-11-2020  Noreene Larsson RN, MSN, [facility] Mobile: [phone]

## 2020-02-17 NOTE — Telephone Encounter (Signed)
Copied from [place] [phone]. Topic: General - Other >> Feb 10, 2020 11:46 AM Antonieta Iba C wrote: Reason for CRM: Alana , Medical examiner with [facility] called in to make provider aware that pt has passed away. She would like a call back at: [phone]

## 2020-02-17 NOTE — Telephone Encounter (Signed)
Can you please call them. I'm happy to fill out the death certificate if they think it was natural causes.

## 2020-02-17 NOTE — Telephone Encounter (Signed)
Called and spoke with ME, they wanted to make sure that you would fill out the death certificate. Patients daughter notified that you will fill out certificate.

## 2020-02-17 DEATH — deceased

## 2020-07-11 IMAGING — US LEFT LOWER EXTREMITY SOFT TISSUE ULTRASOUND LIMITED
2 series · 14 of 25 positions shown · non-contrast
Comparison: None.

CLINICAL DATA: Soft tissue mass over the fibular head.
Neurofibromatosis.

EXAM:
ULTRASOUND LEFT LOWER EXTREMITY LIMITED
TECHNIQUE: Ultrasound examination of the lower extremity soft tissues was
performed in the area of clinical concern.

[Series 1: left lower extremity soft tissue ultrasound limite · 6 of 12 slices shown (1 of 2)]
[im 1/12]
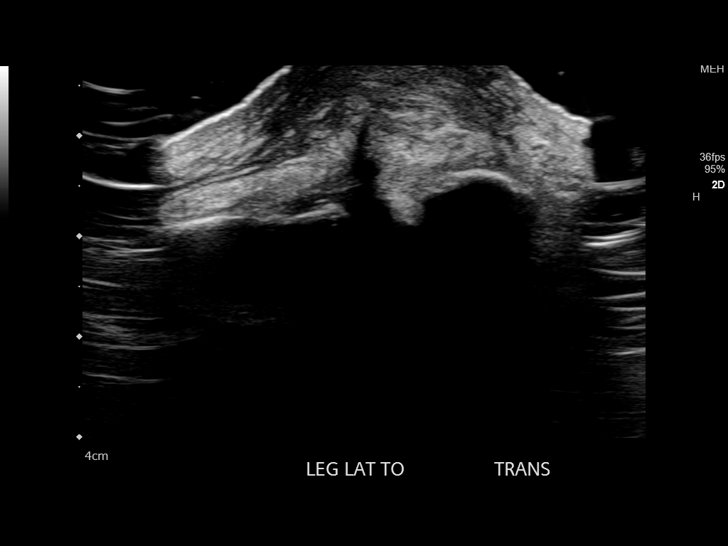
[im 3/12]
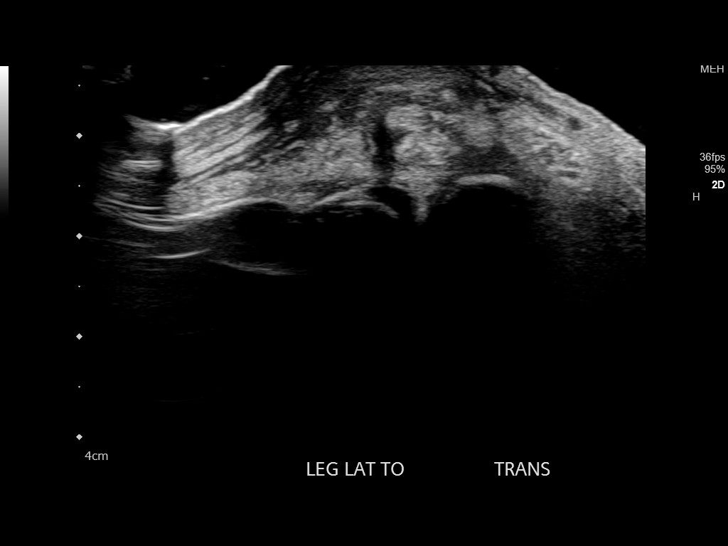
[im 5/12]
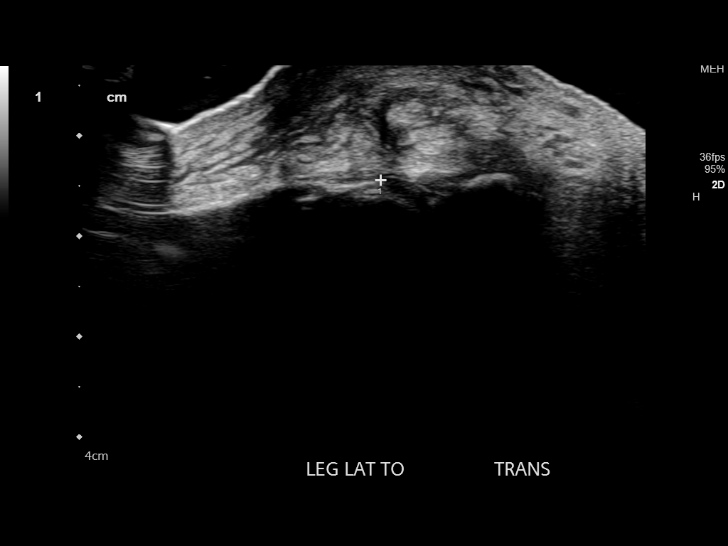
[im 8/12]
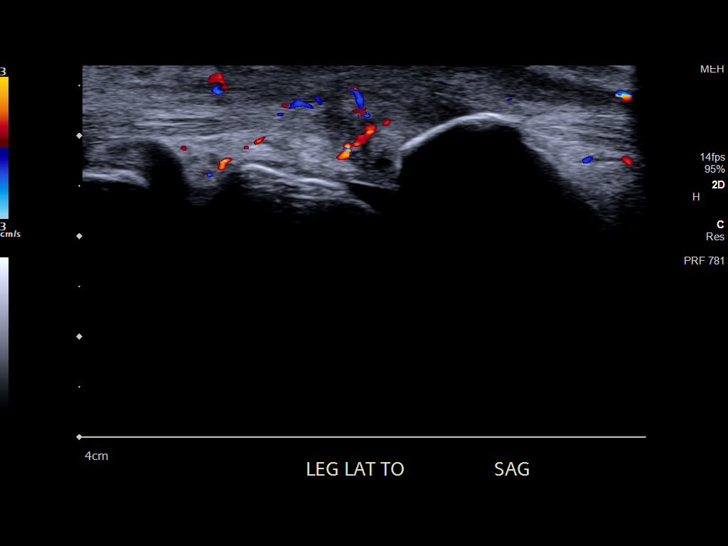
[im 10/12]
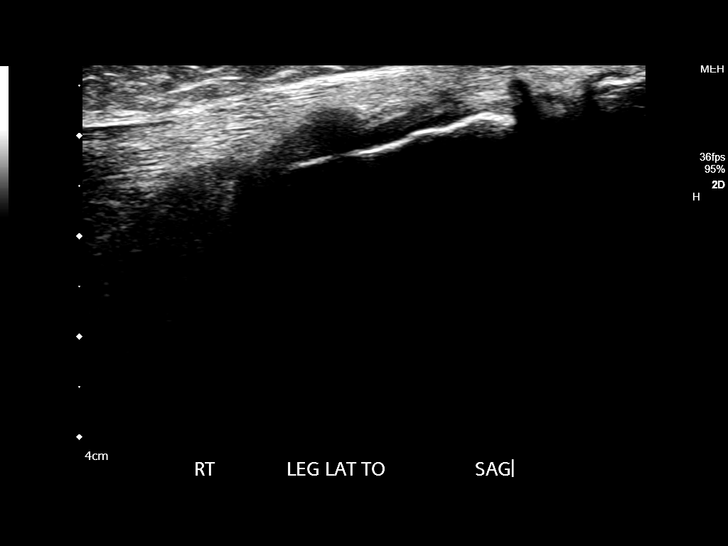
[im 12/12]
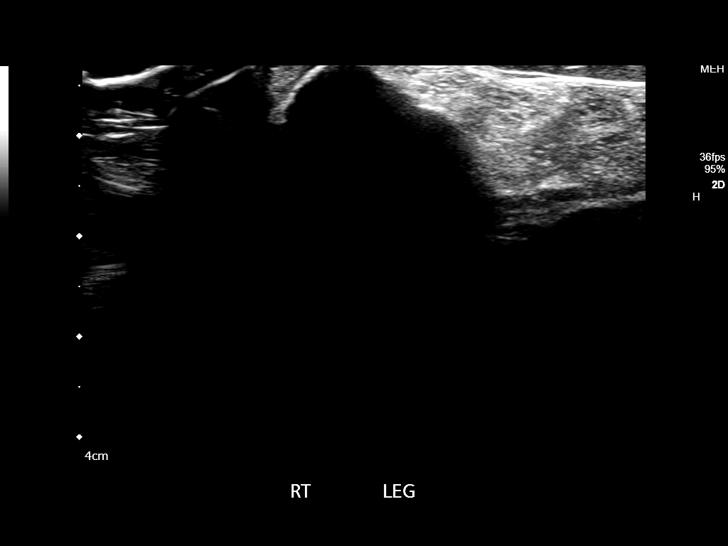

[Series 2: left lower extremity soft tissue ultrasound limite · 17 acquisitions, 8 frames shown (2 of 2)]
[im 2/17]
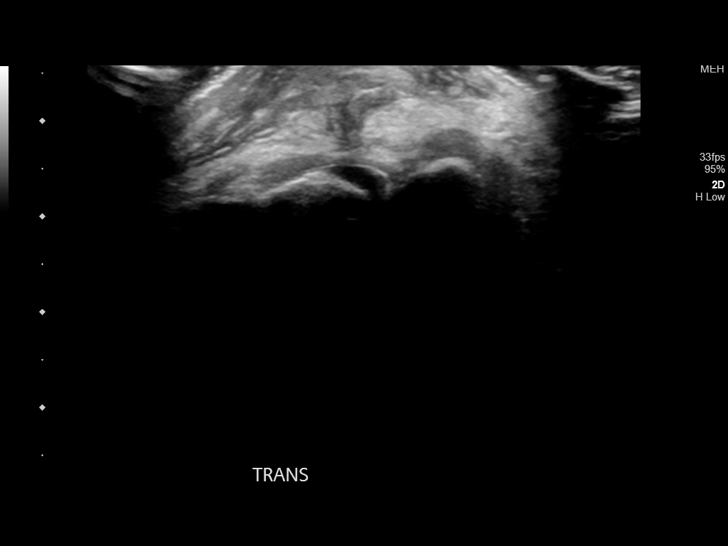
[im 4/17]
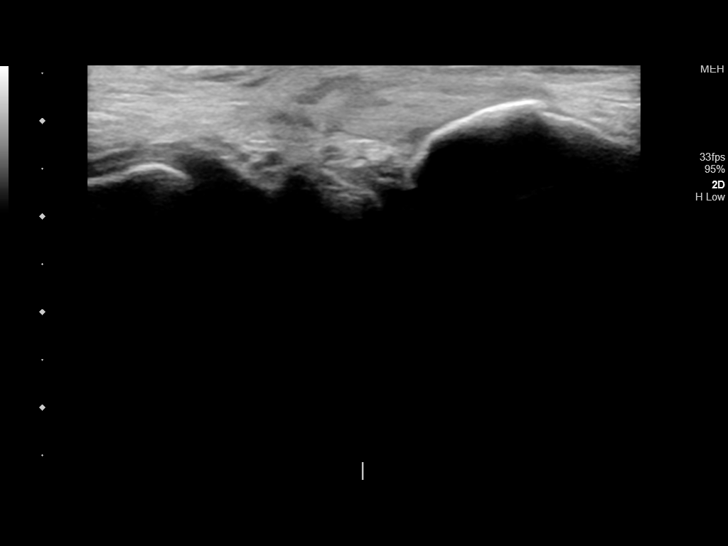
[im 6/17]
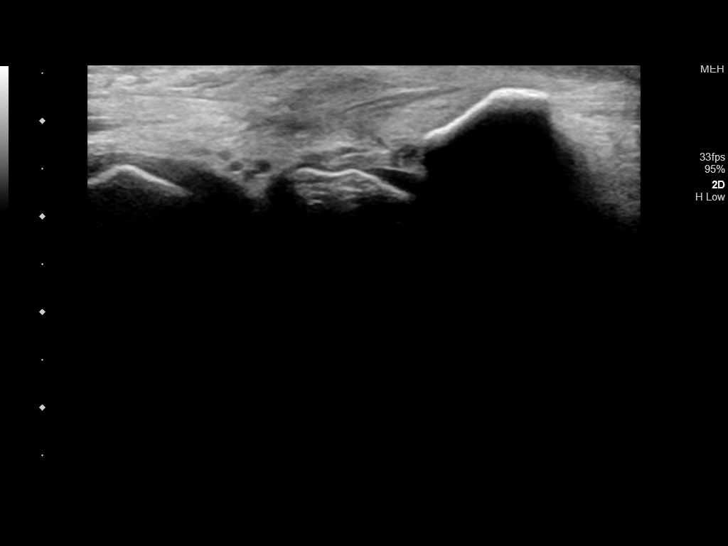
[im 7/17]
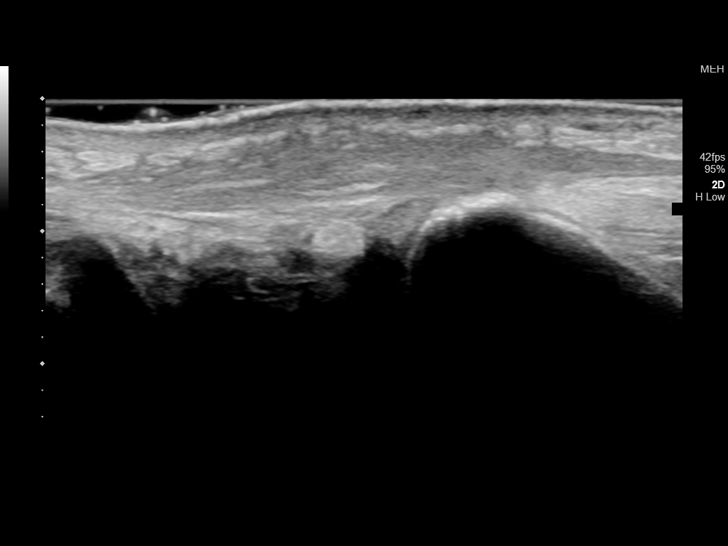
[im 10/17]
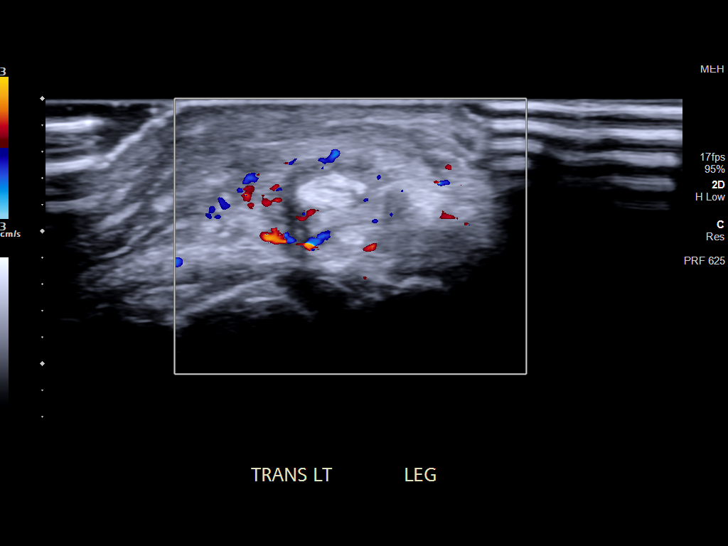
[im 12/17]
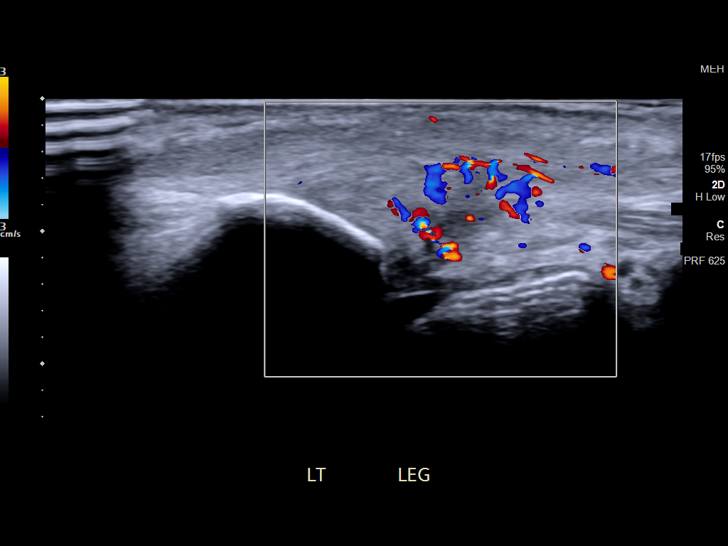
[im 14/17]
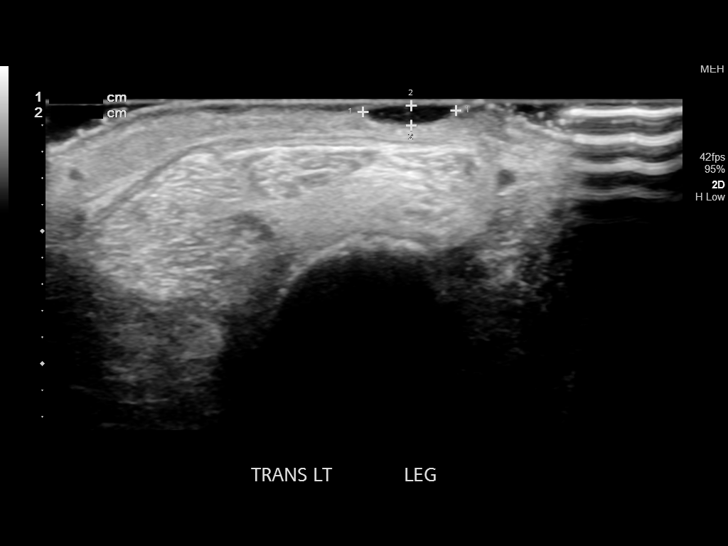
[im 17/17]
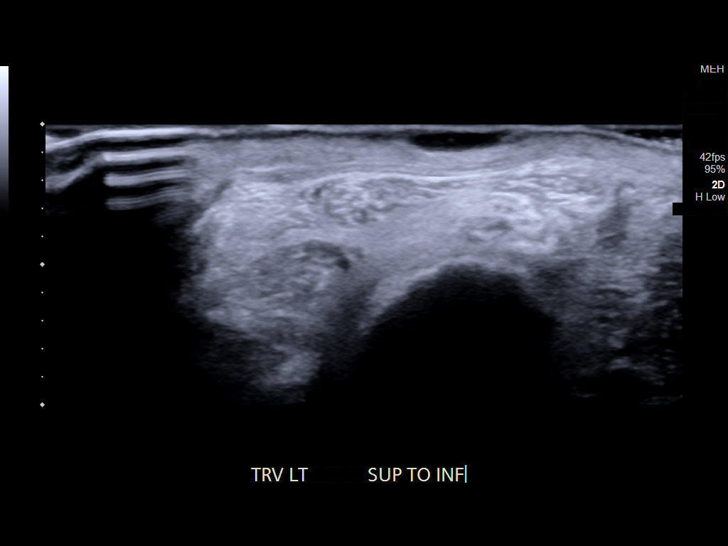

[14 of 25 positions shown; findings below may reference images not displayed]

FINDINGS: There is a poorly defined inhomogeneous slightly tubular appearing
mass adjacent to the fibular head with a small amount of fluid
adjacent to it. There is edema in the adjacent soft tissues with
some fluid around the tubular components. No significant abnormal
perfusion in that area. No appreciable joint effusion at the
proximal tibiofibular joint.
IMPRESSION: Indeterminate inhomogeneous mass adjacent to the fibular head. Given
the patient's history of neurofibromatosis, this is likely to
represent a plexiform neurofibroma. MRI with and without contrast is
recommended for further characterization since these can undergo
malignant degeneration.

## 2020-10-11 ENCOUNTER — Ambulatory Visit: Payer: Medicare HMO

## 2021-12-21 ENCOUNTER — Telehealth: Payer: Self-pay | Admitting: Urology

## 2021-12-21 NOTE — Telephone Encounter (Signed)
Pt daughter, Lenna Sciara, who is on the DPR, called wanting to received her mothers medical record. Pt was last seen here 03/2018 with [facility].  Please advise.  Thank you

## 2021-12-26 NOTE — Telephone Encounter (Signed)
LM with Brenda Rocha to call me. Unsure how or if We can release the pts records.

## 2022-01-02 NOTE — Telephone Encounter (Signed)
Called Brenda Rocha (pts Daughter).  Brenda Rocha aware per HIM we need to know who the executor of her Moms estate is. Per Brenda Rocha there isn't one. No living spouse either. Brenda Rocha is listed on the pts FYI.   She can sign a MRR for her Mom's records. She will need to bring proof of identification. She voiced understanding. We have 10 days to print records.
